# Patient Record
Sex: Female | Born: 1970 | Race: White | Hispanic: No | Marital: Single | Smoking: Never smoker
Health system: Southern US, Community
[De-identification: ages and names within clinical notes are randomized; demographics above are authoritative.]

## PROBLEM LIST (undated history)

## (undated) DIAGNOSIS — Z9889 Other specified postprocedural states: Secondary | ICD-10-CM

## (undated) DIAGNOSIS — G43809 Other migraine, not intractable, without status migrainosus: Secondary | ICD-10-CM

## (undated) DIAGNOSIS — F419 Anxiety disorder, unspecified: Secondary | ICD-10-CM

## (undated) DIAGNOSIS — I499 Cardiac arrhythmia, unspecified: Secondary | ICD-10-CM

## (undated) DIAGNOSIS — D119 Benign neoplasm of major salivary gland, unspecified: Secondary | ICD-10-CM

## (undated) DIAGNOSIS — R87619 Unspecified abnormal cytological findings in specimens from cervix uteri: Secondary | ICD-10-CM

## (undated) DIAGNOSIS — M1990 Unspecified osteoarthritis, unspecified site: Secondary | ICD-10-CM

## (undated) DIAGNOSIS — R55 Syncope and collapse: Secondary | ICD-10-CM

## (undated) DIAGNOSIS — B977 Papillomavirus as the cause of diseases classified elsewhere: Secondary | ICD-10-CM

## (undated) DIAGNOSIS — R112 Nausea with vomiting, unspecified: Secondary | ICD-10-CM

## (undated) DIAGNOSIS — L309 Dermatitis, unspecified: Secondary | ICD-10-CM

## (undated) DIAGNOSIS — I493 Ventricular premature depolarization: Secondary | ICD-10-CM

## (undated) DIAGNOSIS — K219 Gastro-esophageal reflux disease without esophagitis: Secondary | ICD-10-CM

## (undated) HISTORY — DX: Other migraine, not intractable, without status migrainosus: G43.809

## (undated) HISTORY — DX: Unspecified osteoarthritis, unspecified site: M19.90

## (undated) HISTORY — PX: ELBOW SURGERY: SHX618

## (undated) HISTORY — DX: Papillomavirus as the cause of diseases classified elsewhere: B97.7

## (undated) HISTORY — DX: Cardiac arrhythmia, unspecified: I49.9

## (undated) HISTORY — PX: EGD: SHX3789

## (undated) HISTORY — DX: Unspecified abnormal cytological findings in specimens from cervix uteri: R87.619

## (undated) HISTORY — DX: Benign neoplasm of major salivary gland, unspecified: D11.9

## (undated) HISTORY — PX: COLONOSCOPY: SHX174

## (undated) HISTORY — DX: Nausea with vomiting, unspecified: R11.2

## (undated) HISTORY — DX: Other specified postprocedural states: Z98.890

## (undated) HISTORY — PX: COLPOSCOPY: SHX161

## (undated) HISTORY — DX: Anxiety disorder, unspecified: F41.9

## (undated) HISTORY — PX: KNEE SURGERY: SHX244

## (undated) HISTORY — PX: ANKLE SURGERY: SHX546

## (undated) HISTORY — DX: Ventricular premature depolarization: I49.3

---

## 2013-11-27 ENCOUNTER — Emergency Department: Payer: BLUE CROSS/BLUE SHIELD

## 2013-11-27 ENCOUNTER — Emergency Department
Admission: EM | Admit: 2013-11-27 | Discharge: 2013-11-27 | Disposition: A | Discharge status: Home / Self Care | Payer: BLUE CROSS/BLUE SHIELD | Attending: Emergency Medicine | Admitting: Emergency Medicine

## 2013-11-27 DIAGNOSIS — S93401A Sprain of unspecified ligament of right ankle, initial encounter: Secondary | ICD-10-CM | POA: Insufficient documentation

## 2013-11-27 DIAGNOSIS — X58XXXA Exposure to other specified factors, initial encounter: Secondary | ICD-10-CM | POA: Insufficient documentation

## 2013-11-27 DIAGNOSIS — Y93K1 Activity, walking an animal: Secondary | ICD-10-CM | POA: Insufficient documentation

## 2013-11-27 DIAGNOSIS — S93491A Sprain of other ligament of right ankle, initial encounter: Secondary | ICD-10-CM

## 2013-11-27 MED ORDER — IBUPROFEN 600 MG PO TABS
600.0000 mg | ORAL_TABLET | Freq: Four times a day (QID) | ORAL | Status: DC | PRN
Start: 2013-11-27 — End: 2014-11-08

## 2013-11-27 MED ORDER — IBUPROFEN 600 MG PO TABS
600.0000 mg | ORAL_TABLET | Freq: Once | ORAL | Status: AC
Start: 2013-11-27 — End: 2013-11-27
  Administered 2013-11-27: 600 mg via ORAL
  Filled 2013-11-27: qty 1

## 2013-11-27 NOTE — ED Notes (Signed)
Patient was hiking and twisted right ankle.  Swelling to right lateral ankle.  No head injury.

## 2013-11-27 NOTE — Discharge Instructions (Signed)
Ankle Sprain    You have been diagnosed with an ankle sprain.    A sprain is a ligament injury, usually a tear or partial tear. Sprains can hurt as much as broken bones. Sprains can be classified by the degree of injury. A first-degree sprain is considered a minor tear. A second-degree sprain is a partial tear of the ligament. A third-degree sprain often involves a small fracture, or break, of the bone that the ligament is attached to.    Sprains are usually treated with pain medication and a splint to keep the joint from moving. You should Rest, Ice, Compress, and Elevate the injured ankle. Remember this as "RICE."   REST: Limit the use of the injured body part.   ICE: By applying ice to the affected area, swelling and pain can be reduced. Place some ice cubes in a re-sealable (Ziploc) bag and add some water. Put a thin washcloth between the bag and the skin. Apply the ice bag to the area for at least 20 minutes. Do this at least 4 times per day. Using the ice for longer times and more frequently is OK. NEVER APPLY ICE DIRECTLY TO THE SKIN.   COMPRESS: Compression means to apply pressure around the injured area such as with a splint, cast or an ACE bandage. Compression decreases swelling and improves comfort. Compression should be tight enough to relieve swelling but not so tight as to decrease circulation. Increasing pain, numbness, tingling, or change in skin color, are all signs of decreased circulation.   ELEVATE: Elevate the injured part. For example, a sprained ankle can be placed up on a chair while sitting and propped up on pillows while lying down.    You have been given an ACE BANDAGE. The bandage will compress the ankle. This increases comfort and reduces swelling. The ACE bandage should fit snugly but not so tight as to decrease circulation (blood supply). Watch for swelling of the area outside the ACE wrap. Check capillary refill (circulation) in your toenails. To do this, press on the  nail. It should turn white. When you let go, the nail should return to pink in less than 2 seconds. If it doesn't, the bandage is too tight. Loosen the wrap if you need to.    Wear the ACE bandage:   For the next 1-2 weeks.    Ankle exercises are described below. Begin the exercises as soon as you are able. They will make the ankle stronger to prevent new injuries. Do the exercises 5 to 10 times each day.   Use your big toe to draw out the letters of the alphabet on the ground. Move your ankle as you make each letter.   Sit with your leg straight out in front of you. Wrap a towel around the ball of your foot (just below your toes) and pull back. Pull hard enough to stretch the ankle. Don't pull hard enough to cause pain. Hold the stretch for 30 seconds.   Stand up. Rock onto the tiptoes of the injured foot and then return to the flat position. Repeat 10 times.   Rotate your ankle in a circle. Make 10 clockwise circles, then make 10 circles going the other way.    YOU SHOULD SEEK MEDICAL ATTENTION IMMEDIATELY, EITHER HERE OR AT THE NEAREST EMERGENCY DEPARTMENT, IF ANY OF THE FOLLOWING OCCURS:   Your pain gets much worse.   Your ankle or foot starts to tingle or it becomes numb.   Your foot   is cold or pale. This might mean there is a problem with circulation (blood supply).

## 2013-11-27 NOTE — ED Provider Notes (Signed)
EMERGENCY DEPARTMENT HISTORY AND PHYSICAL EXAM     Physician/Midlevel provider first contact with patient: 11/27/13 1700         Date: 11/27/2013  Patient Name: Tiffany Suarez    History of Presenting Illness     Chief Complaint   Patient presents with   . Ankle Pain       History Provided By: Pt    Chief Complaint: Ankle pain  Onset: 1 hr ago  Timing: sp twisting her ankle while walking her dog.  Location: R ankle, outside  Quality: Throbbing  Severity: Moderate  Modifying Factors: Worse when walking  Associated Symptoms: Denies    Additional History: Tiffany Suarez is a 43 y.o. female presents to the [facility] w R ankle pain after twisting her ankle after he dog yanked her while walking. Denies fall, head trauma, LOC. Denies numbness/tingling.     PCP: Pcp, Noneorunknown, MD      No current facility-administered medications for this encounter.     Current Outpatient Prescriptions   Medication Sig Dispense Refill   . ibuprofen (ADVIL,MOTRIN) 600 MG tablet Take 1 tablet (600 mg total) by mouth every 6 (six) hours as needed for Pain or Fever. 30 tablet 0   . medroxyprogesterone (DEPO-PROVERA) 150 MG/ML injection Inject 150 mg into the muscle every 3 (three) months.         Past History     Past Medical History:  History reviewed. No pertinent past medical history.    Past Surgical History:  Past Surgical History   Procedure Laterality Date   . No past surgeries         Family History:  History reviewed. No pertinent family history.    Social History:  History   Substance Use Topics   . Smoking status: Never Smoker    . Smokeless tobacco: Not on file   . Alcohol Use: Yes      Comment: social       Allergies:  Allergies   Allergen Reactions   . Sulfa Antibiotics        Review of Systems     Review of Systems   Constitutional: Negative for fever, chills and weight loss.   HENT: Negative for sore throat.    Respiratory: Negative for cough, shortness of breath and wheezing.    Cardiovascular: Negative for chest pain and  palpitations.   Gastrointestinal: Negative for vomiting, abdominal pain and diarrhea.   Genitourinary: Negative for frequency.   Musculoskeletal: Positive for joint pain. Negative for myalgias, back pain, falls and neck pain.   Skin: Negative for itching and rash.   Neurological: Negative for headaches.          Physical Exam     BP 126/60 mmHg  Pulse 95  Temp(Src) 97 F (36.1 C)  Resp 16  Ht 1.676 m  Wt 58.968 kg  BMI 20.99 kg/m2  SpO2 100%    Physical Exam   Constitutional: She appears well-developed and well-nourished. No distress.   Cardiovascular: Normal rate, regular rhythm, normal heart sounds and intact distal pulses.    Pulmonary/Chest: Effort normal and breath sounds normal. No respiratory distress. She has no wheezes.   Musculoskeletal: She exhibits edema and tenderness.        Right ankle: She exhibits decreased range of motion, swelling (Mild. Laterally) and ecchymosis (Mild, laterally). She exhibits no deformity, no laceration and normal pulse. Tenderness. Lateral malleolus and AITFL tenderness found. No medial malleolus, no CF ligament, no posterior TFL, no head  of 5th metatarsal and no proximal fibula tenderness found. Achilles tendon exhibits no pain and no defect.        Right upper leg: Normal.        Right lower leg: Normal.        Right foot: Normal. There is normal range of motion, no tenderness, no bony tenderness, no swelling, normal capillary refill, no crepitus, no deformity and no laceration.   Skin: Skin is warm and dry. She is not diaphoretic.   Nursing note and vitals reviewed.      Diagnostic Study Results     Labs -     Results     ** No results found for the last 24 hours. **          Radiologic Studies -   Radiology Results (24 Hour)     Procedure Component Value Units Date/Time    Ankle Right 3+ Views [[id number]] Collected:  11/27/13 1642    Order Status:  Completed Updated:  11/27/13 1647    Narrative:      History: Pt stated she got pulled by her dog while walking and  twisted  her ankle. Shielded Visit Pt Loc: [facility]-    PA and lateral and 2 oblique views demonstrate lateral soft tissue  swelling without acute fracture or dislocation. There is no bony  destructive lesion      Impression:       No acute fracture seen    Laurena Slimmer, MD   11/27/2013 4:43 PM          Medical Decision Making   I, Vira Browns, PA-C am the first provider for this patient.    I reviewed the vital signs, available nursing notes, past medical history, past surgical history, family history and social history, as appropriate.    Vital Signs-Reviewed the patient's vital signs.   Patient Vitals for the past 12 hrs:   BP Temp Pulse Resp   11/27/13 1531 126/60 mmHg 97 F (36.1 C) 95 16       Pulse Oximetry Analysis - 100% on RA    Assessment & Plan:  DW Dr. Carolynn Comment   43 y.o. female pw R ankle pain sp inverting her ankle.  Xray: Neg acute    -Ace wrap. Crutches. Ibuprofen for pain. Ortho fu. Strict return precautions provided.  The patient voiced understanding.     Diagnosis     Clinical Impression:   1. Sprain of other ligament of right ankle, initial encounter            Nelly Laurence, PA  11/27/13 2233    Harless Litten, MD  11/28/13 [phone]

## 2014-04-04 ENCOUNTER — Other Ambulatory Visit (INDEPENDENT_AMBULATORY_CARE_PROVIDER_SITE_OTHER): Payer: Self-pay | Admitting: Orthopaedic Surgery

## 2014-11-08 ENCOUNTER — Encounter (INDEPENDENT_AMBULATORY_CARE_PROVIDER_SITE_OTHER): Payer: Self-pay | Admitting: Orthopaedic Surgery

## 2014-11-08 ENCOUNTER — Ambulatory Visit (INDEPENDENT_AMBULATORY_CARE_PROVIDER_SITE_OTHER): Payer: BLUE CROSS/BLUE SHIELD | Admitting: Orthopaedic Surgery

## 2014-11-08 ENCOUNTER — Ambulatory Visit (INDEPENDENT_AMBULATORY_CARE_PROVIDER_SITE_OTHER): Payer: BLUE CROSS/BLUE SHIELD

## 2014-11-08 VITALS — BP 118/82 | HR 51 | Resp 16 | Ht 67.0 in | Wt 135.0 lb

## 2014-11-08 DIAGNOSIS — G8929 Other chronic pain: Secondary | ICD-10-CM

## 2014-11-08 DIAGNOSIS — M7711 Lateral epicondylitis, right elbow: Secondary | ICD-10-CM

## 2014-11-08 DIAGNOSIS — M25521 Pain in right elbow: Secondary | ICD-10-CM

## 2014-11-08 DIAGNOSIS — M7710 Lateral epicondylitis, unspecified elbow: Secondary | ICD-10-CM | POA: Insufficient documentation

## 2014-11-08 MED ORDER — DIFLUNISAL 500 MG PO TABS
500.0000 mg | ORAL_TABLET | Freq: Two times a day (BID) | ORAL | Status: DC
Start: 2014-11-08 — End: 2014-11-21

## 2014-11-08 NOTE — Progress Notes (Signed)
Tiffany Suarez is a 44 y.o. female with the following history as recorded in EpicCare:  Patient Active Problem List    Diagnosis Date Noted   . Elbow pain, chronic, right 11/08/2014   . Lateral epicondylitis (tennis elbow) 11/08/2014     Current Outpatient Prescriptions   Medication Sig Dispense Refill   . medroxyprogesterone (DEPO-PROVERA) 150 MG/ML injection Inject 150 mg into the muscle every 3 (three) months.     . diflunisal (DOLOBID) 500 MG Tab tablet Take 1 tablet (500 mg total) by mouth 2 (two) times daily. 30 tablet 2     No current facility-administered medications for this visit.     Allergies: Sulfa antibiotics  History reviewed. No pertinent past medical history.  Past Surgical History   Procedure Laterality Date   . No past surgeries       History reviewed. No pertinent family history.  Social History   Substance Use Topics   . Smoking status: Never Smoker    . Smokeless tobacco: Not on file   . 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  She was walking 2 dogs when her right ankle gave way and she fell  landing on her right forearm.  She has had pain on the lateral aspect of  the right elbow since that time and occasionally extends up the arm and  down to the wrist, even into the fingers on occasion.  Her primary care  physician has prescribed tramadol and a Medrol Dosepak with temporary  relief.  She was then switched to Tylenol No. 3 and gabapentin, which she  is not taking.  She has soreness when she is getting dressed, when she  brushing her hair, and when she writing.     GENERAL HEALTH, MEDICATIONS, ALLERGIES:  Otherwise noted on the Epic record above.     SOCIAL HISTORY, FAMILY HISTORY:  Noted on the Epic  record above.     REVIEW OF SYSTEMS:  Noted on the Epic record above as outlined in the HPI.  All other systems  reviewed are negative.     Exam reveals a healthy patient in no acute distress.  She has satisfactory  cervical spine range of motion with no referred pain.  She has satisfactory  motion in the shoulders, elbows, wrists, and hands with good strength  throughout.  There is no pain on active motion of the right shoulder  against resistance and there is good strength about the shoulder.  The  right elbow reveals full range of motion, tenderness at the lateral  epicondyle aggravated by wrist extension against resistance.  There is no  tenderness elsewhere in the arm.  There is no swelling in the arm.  No skin  rashes or lesions in the arm.  The neurovascular status of both arms is  intact.     X-rays of right elbow show no gross abnormalities.     IMPRESSION:  1.  Pain, right elbow.  2.  Lateral epicondylitis (tennis elbow) right elbow.     Treatment options were discussed.  It was elected to  proceed with a  conservative program of ice for pain and swelling.  Dolobid was prescribed  for pain.  She will begin physical therapy.  She is to avoid pushups, pull  ups, and weight training until next visit in 6 weeks.  She will work her  elbow and wrist exercises daily.  If there is ongoing pain in 6 weeks, a  local cortisone injection to the lateral epicondyle of the right elbow  would be the next step.

## 2014-11-08 NOTE — Patient Instructions (Signed)
Ice for elbow pain/swelling   Dolobid for pain  PT as directed  Elbow exercises daily  Avoid push-ups, pull-ups and heavy weight training for 6 weeks   F/U 6 weeks

## 2014-11-13 ENCOUNTER — Telehealth (INDEPENDENT_AMBULATORY_CARE_PROVIDER_SITE_OTHER): Payer: Self-pay

## 2014-11-20 ENCOUNTER — Encounter (INDEPENDENT_AMBULATORY_CARE_PROVIDER_SITE_OTHER): Payer: Self-pay

## 2014-11-21 ENCOUNTER — Ambulatory Visit: Payer: BLUE CROSS/BLUE SHIELD

## 2014-11-22 ENCOUNTER — Ambulatory Visit: Payer: BLUE CROSS/BLUE SHIELD | Admitting: Pain Medicine

## 2014-11-22 ENCOUNTER — Ambulatory Visit
Admission: RE | Admit: 2014-11-22 | Discharge: 2014-11-22 | Disposition: A | Discharge status: Home / Self Care | Payer: BLUE CROSS/BLUE SHIELD | Source: Ambulatory Visit | Attending: Foot Surgery | Admitting: Foot Surgery

## 2014-11-22 ENCOUNTER — Ambulatory Visit: Payer: BLUE CROSS/BLUE SHIELD | Admitting: Foot Surgery

## 2014-11-22 ENCOUNTER — Encounter
Admission: RE | Disposition: A | Discharge status: Home / Self Care | Payer: Self-pay | Source: Ambulatory Visit | Attending: Foot Surgery

## 2014-11-22 DIAGNOSIS — S93491A Sprain of other ligament of right ankle, initial encounter: Secondary | ICD-10-CM | POA: Insufficient documentation

## 2014-11-22 DIAGNOSIS — W19XXXA Unspecified fall, initial encounter: Secondary | ICD-10-CM | POA: Insufficient documentation

## 2014-11-22 DIAGNOSIS — S86311A Strain of muscle(s) and tendon(s) of peroneal muscle group at lower leg level, right leg, initial encounter: Secondary | ICD-10-CM | POA: Insufficient documentation

## 2014-11-22 HISTORY — PX: REPAIR, LOWER EXTREMITY TENDON: SHX5271

## 2014-11-22 HISTORY — PX: STABILIZATION, ANKLE, BROSTROM PROCEDURE: SHX5553

## 2014-11-22 HISTORY — DX: Syncope and collapse: R55

## 2014-11-22 HISTORY — DX: Dermatitis, unspecified: L30.9

## 2014-11-22 SURGERY — PRIMARY REPAIR, DISRUPTED LIGAMENT, ANKLE, COLLATERAL
Anesthesia: Anesthesia General | Site: Leg Lower | Laterality: Right | Wound class: Clean

## 2014-11-22 MED ORDER — LACTATED RINGERS IV SOLN
INTRAVENOUS | Status: DC | PRN
Start: 2014-11-22 — End: 2014-11-22

## 2014-11-22 MED ORDER — LACTATED RINGERS IV SOLN
50.0000 mL/h | INTRAVENOUS | Status: DC
Start: 2014-11-22 — End: 2014-11-22

## 2014-11-22 MED ORDER — MIDAZOLAM HCL 2 MG/2ML IJ SOLN
INTRAMUSCULAR | Status: AC
Start: 2014-11-22 — End: ?
  Filled 2014-11-22: qty 2

## 2014-11-22 MED ORDER — ONDANSETRON HCL 4 MG/2ML IJ SOLN
INTRAMUSCULAR | Status: AC
Start: 2014-11-22 — End: 2014-11-22
  Administered 2014-11-22: 4 mg via INTRAVENOUS
  Filled 2014-11-22: qty 2

## 2014-11-22 MED ORDER — OXYCODONE-ACETAMINOPHEN 5-325 MG PO TABS
ORAL_TABLET | ORAL | Status: AC
Start: 2014-11-22 — End: 2014-11-22
  Administered 2014-11-22: 1 via ORAL
  Filled 2014-11-22: qty 1

## 2014-11-22 MED ORDER — SODIUM CHLORIDE 0.9 % IV MBP
1.0000 g | INTRAVENOUS | Status: AC
Start: 2014-11-22 — End: 2014-11-22
  Administered 2014-11-22: 1 g via INTRAVENOUS

## 2014-11-22 MED ORDER — LIDOCAINE HCL 1 % IJ SOLN
INTRAMUSCULAR | Status: AC
Start: 2014-11-22 — End: ?
  Filled 2014-11-22: qty 20

## 2014-11-22 MED ORDER — FENTANYL CITRATE (PF) 50 MCG/ML IJ SOLN (WRAP)
25.0000 ug | INTRAMUSCULAR | Status: AC | PRN
Start: 2014-11-22 — End: 2014-11-22
  Administered 2014-11-22 (×3): 25 ug via INTRAVENOUS

## 2014-11-22 MED ORDER — OXYCODONE-ACETAMINOPHEN 5-325 MG PO TABS
1.0000 | ORAL_TABLET | Freq: Once | ORAL | Status: AC
Start: 2014-11-22 — End: 2014-11-22

## 2014-11-22 MED ORDER — FENTANYL CITRATE (PF) 50 MCG/ML IJ SOLN (WRAP)
INTRAMUSCULAR | Status: DC | PRN
Start: 2014-11-22 — End: 2014-11-22
  Administered 2014-11-22: 25 ug via INTRAVENOUS
  Administered 2014-11-22: 50 ug via INTRAVENOUS
  Administered 2014-11-22: 25 ug via INTRAVENOUS

## 2014-11-22 MED ORDER — OXYCODONE-ACETAMINOPHEN 5-325 MG PO TABS
1.0000 | ORAL_TABLET | Freq: Once | ORAL | Status: DC | PRN
Start: 2014-11-22 — End: 2014-11-22

## 2014-11-22 MED ORDER — MIDAZOLAM HCL 2 MG/2ML IJ SOLN
INTRAMUSCULAR | Status: DC | PRN
Start: 2014-11-22 — End: 2014-11-22
  Administered 2014-11-22: 2 mg via INTRAVENOUS

## 2014-11-22 MED ORDER — DEXAMETHASONE SODIUM PHOSPHATE 4 MG/ML IJ SOLN (WRAP)
INTRAMUSCULAR | Status: DC | PRN
Start: 2014-11-22 — End: 2014-11-22
  Administered 2014-11-22: 10 mg via INTRAVENOUS

## 2014-11-22 MED ORDER — BUPIVACAINE HCL 0.5 % IJ SOLN
INTRAMUSCULAR | Status: DC | PRN
Start: 2014-11-22 — End: 2014-11-22
  Administered 2014-11-22: 10 mL

## 2014-11-22 MED ORDER — PHENYLEPHRINE HCL 10 MG/ML IV SOLN (WRAP)
Status: DC | PRN
Start: 2014-11-22 — End: 2014-11-22
  Administered 2014-11-22 (×2): 50 ug via INTRAVENOUS
  Administered 2014-11-22 (×2): 100 ug via INTRAVENOUS

## 2014-11-22 MED ORDER — LIDOCAINE HCL (PF) 2 % IJ SOLN
INTRAMUSCULAR | Status: AC
Start: 2014-11-22 — End: ?
  Filled 2014-11-22: qty 5

## 2014-11-22 MED ORDER — PROPOFOL 10 MG/ML IV EMUL (WRAP)
INTRAVENOUS | Status: AC
Start: 2014-11-22 — End: ?
  Filled 2014-11-22: qty 50

## 2014-11-22 MED ORDER — PHENYLEPHRINE 100 MCG/ML IN NACL 0.9% IV SOSY
PREFILLED_SYRINGE | INTRAVENOUS | Status: AC
Start: 2014-11-22 — End: ?
  Filled 2014-11-22: qty 5

## 2014-11-22 MED ORDER — FENTANYL CITRATE (PF) 50 MCG/ML IJ SOLN (WRAP)
INTRAMUSCULAR | Status: AC
Start: 2014-11-22 — End: 2014-11-22
  Administered 2014-11-22: 25 ug via INTRAVENOUS
  Filled 2014-11-22: qty 2

## 2014-11-22 MED ORDER — FENTANYL CITRATE (PF) 50 MCG/ML IJ SOLN (WRAP)
INTRAMUSCULAR | Status: AC
Start: 2014-11-22 — End: ?
  Filled 2014-11-22: qty 2

## 2014-11-22 MED ORDER — LIDOCAINE HCL 2 % IJ SOLN
INTRAMUSCULAR | Status: DC | PRN
Start: 2014-11-22 — End: 2014-11-22
  Administered 2014-11-22: 60 mg

## 2014-11-22 MED ORDER — DEXAMETHASONE SOD PHOSPHATE PF 10 MG/ML IJ SOLN
INTRAMUSCULAR | Status: AC
Start: 2014-11-22 — End: ?
  Filled 2014-11-22: qty 1

## 2014-11-22 MED ORDER — PROMETHAZINE HCL 25 MG/ML IJ SOLN
6.2500 mg | Freq: Once | INTRAMUSCULAR | Status: DC | PRN
Start: 2014-11-22 — End: 2014-11-22

## 2014-11-22 MED ORDER — SODIUM CHLORIDE 0.9 % IV SOLN
INTRAVENOUS | Status: AC
Start: 2014-11-22 — End: ?
  Filled 2014-11-22: qty 100

## 2014-11-22 MED ORDER — LIDOCAINE HCL 1 % IJ SOLN
INTRAMUSCULAR | Status: DC | PRN
Start: 2014-11-22 — End: 2014-11-22
  Administered 2014-11-22: 10 mL

## 2014-11-22 MED ORDER — PROPOFOL INFUSION 10 MG/ML
INTRAVENOUS | Status: DC | PRN
Start: 2014-11-22 — End: 2014-11-22
  Administered 2014-11-22: 150 mg via INTRAVENOUS

## 2014-11-22 MED ORDER — ONDANSETRON HCL 4 MG/2ML IJ SOLN
INTRAMUSCULAR | Status: DC | PRN
Start: 2014-11-22 — End: 2014-11-22
  Administered 2014-11-22: 4 mg via INTRAVENOUS

## 2014-11-22 MED ORDER — BUPIVACAINE HCL (PF) 0.5 % IJ SOLN
INTRAMUSCULAR | Status: AC
Start: 2014-11-22 — End: ?
  Filled 2014-11-22: qty 30

## 2014-11-22 MED ORDER — HYDROMORPHONE HCL 1 MG/ML IJ SOLN
0.5000 mg | INTRAMUSCULAR | Status: DC | PRN
Start: 2014-11-22 — End: 2014-11-22

## 2014-11-22 MED ORDER — CEFAZOLIN SODIUM 1 G IJ SOLR
INTRAMUSCULAR | Status: AC
Start: 2014-11-22 — End: ?
  Filled 2014-11-22: qty 1000

## 2014-11-22 MED ORDER — ONDANSETRON HCL 4 MG/2ML IJ SOLN
4.0000 mg | Freq: Once | INTRAMUSCULAR | Status: AC | PRN
Start: 2014-11-22 — End: 2014-11-22

## 2014-11-22 MED ORDER — ONDANSETRON HCL 4 MG/2ML IJ SOLN
INTRAMUSCULAR | Status: AC
Start: 2014-11-22 — End: ?
  Filled 2014-11-22: qty 2

## 2014-11-22 SURGICAL SUPPLY — 66 items
BANDAGE CMPR PLSTR CTTN CRTY CNFRM 75X3 (Bandage) ×2 IMPLANT
BANDAGE CMPR PLSTR CTTN MED MTRX 5YDX6IN (Bandage) ×2
BANDAGE ELASTIC L5 YD X W6 IN W/SELF-CLOSURE HOOK (Bandage) ×4 IMPLANT
BANDAGE ESMARK STRL 4INX9FT (Procedure Accessories) ×3 IMPLANT
BANDAGE MEDIUM ELASTIC MATRIX POLYESTER COTTON L5 YD X W6 IN (Bandage) IMPLANT
BANDAGE MEDLINE MEDIUM COMPRESSION L5 YD (Bandage) ×4
BLADE S/SU RIBBACK CARB STL 15 (Blade) ×6 IMPLANT
BLADE SAG 4.5X25.5X.4MM (Blade) ×3 IMPLANT
BLADE SAGITTAL 9.5X25.5X 4MM (Blade) ×3 IMPLANT
DRAPE 3/4 SHEET FANFLD 52X76IN (Drape) ×3 IMPLANT
DRAPE LWR EXT 114X88X126IN (Drape) ×3 IMPLANT
DRESSING ADAPTIC NON-ADH ROLL (Dressing) ×3 IMPLANT
DRESSING PETRO 3% BI 3BRM GZE XR 8X1IN (Dressing) ×1
DRESSING PETROLATUM XEROFORM L8 IN X W1 (Dressing) ×2
DRESSING PETROLATUM XEROFORM L8 IN X W1 IN 3% BISMUTH TRIBROMOPHENATE (Dressing) IMPLANT
ELECTRODE ELECTROSURGICAL BLADE PENCIL (Cautery) ×2
ELECTRODE ELECTROSURGICAL BLADE PENCIL L10 FT VALLEYLAB E2515H (Cautery) ×2 IMPLANT
ELECTRODE ESURG SS BLDE PNCL VLAB 10FT (Cautery) ×1
GLOVE SURG BIOGEL ORTHO SZ8 (Glove) ×3 IMPLANT
GLOVE SURG BIOGEL SZ7.5 (Glove) ×6 IMPLANT
GOWN OPTIMA STRL BACK OR (Gown) ×6 IMPLANT
MASTISOL VIAL 2/3CC STRL (Skin Closure) ×3 IMPLANT
NEEDLE 25GA 1 1/2 (Needles) ×6 IMPLANT
NEEDLE ELECTRODE (Cautery) ×3 IMPLANT
PACK EXTREMITY IAH (Pack) ×3 IMPLANT
PAD ELECTROSRG GRND REM W CRD (Procedure Accessories) ×3 IMPLANT
PAD PREP CUFF 24X41IN W 9IN (Prep) ×3 IMPLANT
PADDING CAST L4 YD X W6 IN UNDERCAST (Bandage) ×4
PADDING CAST L4 YD X W6 IN UNDERCAST HAND TEARABLE SPECIALIST 100 (Bandage) IMPLANT
PADDING CST CTTN SPCLST 100 4YDX6IN LF (Bandage) ×2
SOL IRR 0.9% NACL 500ML PLS PR BTL ISTNC (Irrigation Solutions) ×1
SOLUTION IRRIGATION 0.9% SDM CHLORIDE 500ML PR BTTL ISOTONIC NONPRGNC (Irrigation Solutions) ×2 IMPLANT
SOLUTION IRRIGATION 0.9% SODIUM CHLORIDE (Irrigation Solutions) ×2
SOLUTION SRGPRP 74% ISPRP 0.7% IOD (Prep) ×1
SOLUTION SURGICAL PREP 26 ML DURAPREP (Prep) ×2
SOLUTION SURGICAL PREP 26 ML DURAPREP 74% ISOPROPYL ALCOHOL 0.7% (Prep) ×2 IMPLANT
SPLINT ORTHOPEDIC L30 IN X W5 IN PRECUT (Cast) ×2
SPLINT ORTHOPEDIC L30 IN X W5 IN PRECUT LIGHTWEIGHT WASHABLE (Cast) IMPLANT
SPLNT SAFETYSPLINT ORTH 30X5IN FBGLS (Cast) ×1
SPONGE GAUZE L6 3/4 IN X W6 IN MEDIUM (Dressing) ×2
SPONGE GAUZE L6 3/4 IN X W6 IN MEDIUM ABSORBENT FLUFF DRY CRINKLE (Dressing) ×2 IMPLANT
SPONGE GZE CTTN MED KRLX 6.75X6IN LF (Dressing) ×1
STRIP SKIN CLOSURE L3 IN X W.25 IN (Dressing) ×2
STRIP SKIN CLOSURE L3 IN X W.25 IN REINFORCE STERI-STRIP POLYESTER (Dressing) ×2 IMPLANT
STRIP SKIN CLOSURE L4 IN X W1/4 IN (Dressing) ×2
STRIP SKIN CLOSURE L4 IN X W1/4 IN REINFORCE STERI-STRIP POLYESTER (Dressing) ×2 IMPLANT
STRIP SKNCLS PLSTR STRSTRP 3X.25IN LF (Dressing) ×1
STRIP STRSTRP SKNCLS 4X.25IN PLSTR REINF (Dressing) ×1
SUTURE ABS 3-0 SH VCL 27IN BRD COAT VIOL (Suture) ×1
SUTURE COATED VICRYL 3-0 SH L27 IN BRAID (Suture) ×2
SUTURE COATED VICRYL 3-0 SH L27 IN BRAID COATED VIOLET ABSORBABLE (Suture) IMPLANT
SUTURE ETHILON BLACK 3-0 PS-2 L18 IN (Suture) ×4
SUTURE ETHILON BLACK 3-0 PS-2 L18 IN MONOFILAMENT NONABSORBABLE (Suture) IMPLANT
SUTURE NABSB 3-0 PS2 ETH MTPS 18IN MFL (Suture) ×2
SUTURE NABSB 3-0 PS2 PRLN MTPS 18IN MFL (Suture) ×1
SUTURE PROLENE BLUE 3-0 PS-2 L18 IN (Suture) ×2
SUTURE PROLENE BLUE 3-0 PS-2 L18 IN MONOFILAMENT NONABSORBABLE (Suture) ×2 IMPLANT
SUTURE VICRYL 3-0 PS1 18IN (Suture) ×3 IMPLANT
SUTURE VICRYL 4-0 PS1 27IN (Suture) ×6 IMPLANT
SYRINGE LUER LOCK 10CC (Syringes, Needles) ×6 IMPLANT
SYRINGE SLIP-TIP 10CC (Syringes, Needles) ×6 IMPLANT
TOURNIQUET 18IN STRL (Procedure Accessories) ×3 IMPLANT
TOWEL L26 IN X W17 IN COTTON PREWASH (Procedure Accessories) ×2
TOWEL L26 IN X W17 IN COTTON PREWASH DELINT BLUE ACTISORB SURGICAL (Procedure Accessories) ×2 IMPLANT
TOWEL SRG CTTN 26X17IN LF STRL PREWASH (Procedure Accessories) ×1
TRAY EXTREMITY PACK (Pack) ×6 IMPLANT

## 2014-11-22 NOTE — PACU (Signed)
Pt to phase 2 awake, VSS, no distress, pain and nausea controlled, IV patent, dressing to right foot/leg CDI and report given to receiving Rn Janie.

## 2014-11-22 NOTE — Transfer of Care (Signed)
Anesthesia Transfer of Care Note    Patient: Tiffany Suarez    Procedures performed: Procedure(s):  LATERAL ANKLE STABILIZATION; PERONEAL TENDON REPAIR  REPAIR, LOWER EXTREMITY PERONEAL TENDON     Anesthesia type: General LMA    Patient location:Phase I PACU    Last vitals:   Filed Vitals:    11/22/14 1716   BP: 124/76   Pulse: 87   Temp: 36.2 C (97.2 F)   Resp: 16   SpO2: 100%       Post pain: Patient not complaining of pain, continue current therapy      Mental Status:awake    Respiratory Function: tolerating room air    Cardiovascular: stable    Nausea/Vomiting: patient not complaining of nausea or vomiting    Hydration Status: adequate    Post assessment: no apparent anesthetic complications, no reportable events and no evidence of recall

## 2014-11-22 NOTE — Consults (Signed)
H & P  Reviewed, no changes, cleared for surgery.\    Tiffany Suarez [name]

## 2014-11-22 NOTE — Discharge Instructions (Signed)
Discharge Instructions for Ankle Surgery  You had ankle surgery. This surgery is performed for a variety of reasons. You and your doctor discussed your condition and the surgery before the procedure. Here are instructions that will help you care for your ankle when you are at home.  Activity   [place] stay on the first floor of your home as much as possible for the first week after the surgery.   Arrange your household to keep the items you need handy.   Remove electrical cords, throw rugs,and anything else that may cause you to fall.   Use nonslip bath mats, grab bars, an elevated toilet seat, and a shower chair in your bathroom.   Use a cane, crutches, a walker, or handrails untilyour balance, flexibility, and strength improve. And remember to ask for help from others when you need it.   Free up your hands so that you can use them to keep balance. Do this by using a fanny pack, apron, or pockets to carry things.   Follow the weight-bearing instructions given to [facility] your doctor. He or she will tell you how much weight you are--or are not--allowed to put on your ankle.   Do allexercises you learned in the hospital, as instructed by your doctor.   Don't drive until your doctor says it's OK. And never drive if you are taking opioid pain medication.   Follow your doctor's instructions regarding care for your dressing, splint, or cast. A supportive dressing, splint, or cast may be applied after surgery to protect your ankle as it heals.  Home care   Avoid soaking your ankle in water until your incisions are completelyclosed and dried.   Follow your doctor's instructions regarding showering. He or she will tell you when you can begin showering again. Then shower as needed. Carefully cover your ankle with plastic to keep the dressing, splint, or cast dry. To avoid falling while showering, sit on a shower stool or chair.   Use an ice pack or bag of frozen peas--or something similar--wrapped in a thin  towel to reduce the swelling. Keep the foot elevated. Apply the ice pack for28minutes; then remove it for 20 minutes. Repeat as needed.   Take pain medication as directed.   Sleep withtwo pillows under your knee and ankle. Keep your ankle elevated above the level of your heart when sitting in a chair or on the couch.     [address] [employer] [address] [place], [place] [address]. All rights reserved. This information is not intended as a substitute for professional medical care. Always follow your healthcare professional's instructions.    Post Anesthesia Discharge Instructions    Although you may be awake and alert in the recovery room, small amounts of anesthetic remain in your system for about 24 hours.  You may feel tired and sleepy during this time.      You are advised to go directly home from the hospital.    Plan to stay at home and rest for the remainder of the day.    It is advisable to have someone with you at home for 24 hours after surgery.    Do not operate a motor vehicle, or any mechanical or electrical equipment for the next 24 hours.      Be careful when you are walking around, you may become dizzy.  The effects of anesthesia and/or medications are still present and drowsiness may occur    Do not consume alcohol, tranquilizers,  drowsiness may occur    Do not consume alcohol, tranquilizers, sleeping medications, or any other non prescribed medication for the remainder of the day.    Diet:  begin with liquids, progress your diet as tolerated or as directed by your surgeon.  Nausea and vomiting may occur in the next 24 hours.

## 2014-11-22 NOTE — Anesthesia Preprocedure Evaluation (Signed)
Anesthesia Evaluation    AIRWAY    Mallampati: II    TM distance: >3 FB  Neck ROM: full  Mouth Opening:full   CARDIOVASCULAR    cardiovascular exam normal       DENTAL    no notable dental hx     PULMONARY    pulmonary exam normal     OTHER FINDINGS                      Anesthesia Plan    ASA 2     general               (Chart reviewed, patient examined. Questions answered.  Risks, benefits and alternatives discussed with patient.  Patient states understanding and desires to proceed with anesthetic plan.  )      intravenous induction   Detailed anesthesia plan: general IV            informed consent obtained    Plan discussed with CRNA.

## 2014-11-22 NOTE — Anesthesia Postprocedure Evaluation (Signed)
Anesthesia Post Evaluation    Patient: Tiffany Suarez    Procedures performed: Procedure(s):  LATERAL ANKLE STABILIZATION; PERONEAL TENDON REPAIR  REPAIR, LOWER EXTREMITY PERONEAL TENDON     Anesthesia type: General LMA    Patient location:Phase I PACU    Last vitals:   Filed Vitals:    11/22/14 1800   BP: 106/67   Pulse: 87   Temp: 36.8   Resp: 16   SpO2: 100%       Post pain: Patient not complaining of pain, continue current therapy      Mental Status:awake and alert     Respiratory Function: tolerating nasal cannula    Cardiovascular: stable    Nausea/Vomiting: patient not complaining of nausea or vomiting    Hydration Status: adequate    Post assessment: no apparent anesthetic complications

## 2014-11-22 NOTE — Brief Op Note (Signed)
BRIEF OP NOTE    Date Time: 5:16 PM, 11/22/2014      Patient Name:   Tiffany Suarez    Date of Operation:   11/22/2014    Providers Performing:   Dr. 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    Drains:   none    Complications:   None.    Findings: See full op note    Condition:   good. Patient brought to the PACU with AVSS and NVS unchanged to operative foot                                                                              Tillman Abide DPM

## 2014-11-23 ENCOUNTER — Encounter: Payer: Self-pay | Admitting: Foot Surgery

## 2014-11-23 NOTE — Op Note (Addendum)
Procedure Date: 11/22/2014     Patient Type: A     SURGEON: Kandice Robinsons DPM  ASSISTANT:  Tillman Abide DPM     PREOPERATIVE DIAGNOSES:  1.  Anterior talofibular ligament rupture right lower extremity.  2.  Peroneal tendinosis right lower extremity.     POSTOPERATIVE DIAGNOSES:  1.  Anterior talofibular ligament rupture right lower extremity.  2.  Peroneal tendinosis right lower extremity.     TITLE OF PROCEDURE:  1.  Lateral ankle stabilization right lower extremity.  2.  Peritoneal brevis tendon repair right lower extremity.     ANESTHESIA:  General.     ESTIMATED BLOOD LOSS:  5 mL.     HEMOSTASIS:  Pneumatic thigh tourniquet at 350 mmHg for 65 minutes.     IMPLANTS:  None.     MATERIALS:  3-0 Ethibond, 0 Ethibond, 3-0 Vicryl, 4-0 nylon.     INJECTABLES:  Preoperative 20 mL of a 1:1 dilution of 1% lidocaine plain and 0.5%  Marcaine plain.     PATHOLOGY:  None.     ANTIBIOTICS:  One g of Ancef.     DRAINS:  None.     COMPLICATIONS:  None.     CONDITION:  Good, patient brought to PACU with all vital signs stable and neurovascular  status unchanged to operative extremity.     INDICATIONS:  Tiffany Suarez is a 44 year old female who presents to the office of Dr.  Ashok Cordia with the chief complaint of pain to her lateral aspect of  her right ankle.  The patient states that she fell almost a year ago and  injured it and has been dealing with pain since then.  MRI revealed a  complete rupture of her ATFL along with tendinosis to her peroneal brevis  tendon.  Given worsening progression of her symptoms and failure to respond  to conservative measures, and given the worsening progression of her  deformity, and failure to respond to conservative measures, the patient has  elected for operative intervention.  At this time, alternatives, risks, and  complications to the procedure were thoroughly explained to the patient.   The patient exhibits appropriate understanding of all discussion points and  informed  consent is signed in chart with no guarantees of surgical outcome  given or implied.     DESCRIPTION OF PROCEDURE:  The patient was brought back to the operating room, placed on the operating  room table in the lateral decubitus position with beanbag.  The patient was  secured to the table with safety belt.  All bony prominences well padded.   Following the surgical timeout, where all members of the operating room and  surgical site were identified, IV sedation occurred and LMA was placed.   Local anesthesia was then administered about the operative site utilizing  20 mL of a 1:1 dilution of 1% lidocaine plain and 0.5% Marcaine plain.  A  well-padded pneumatic thigh tourniquet was then placed in the superior  thigh position.  The right foot was then prepped and draped in the usual  sterile fashion and following exsanguination with a Esmarch the tourniquet  was inflated to 350 mmHg and the following procedure began.  A well-planned  incision was made overlying the fibula, carrying distally to overlie the  peroneal tendons.  This incision was then made with a 15-blade.  Sharp  dissection was carried down to the level of the peroneal tendon sheath.   Next, utilizing the iris scissors, sharp dissection was  carried down.  The  peroneal tendon sheath was incised and this was followed both proximally  and distally in order to identify the peroneal brevis tendon.  The peroneal  brevis tendon was identified and then examined.  It was noted that there  was a longitudinal partial tear that started just distal to the fibula and  carried down about 4 cm.  This area was then curetted with a curette and  debrided with a 15-blade.  Next, utilizing 3-0 Ethibond in a running suture  type fashion the tendon was reapproximated.     Next, attention was directed to the anterolateral ankle at the level where  the ATFL should be identified.  Sharp dissection was carried down.  The  ATFL was identified and it was noted to be ruptured and in  poor quality.   Next, utilizing 0 Ethibond the tendon was recreated in a horizontal  mattress-type fashion with the foot in dorsiflexed and everted position.   The area was then copiously irrigated with normal saline.  It was noted  that the patient had more stable ankle.  Next, the peroneal tendon sheath  was reapproximated with 3-0 Vicryl in a running suture type fashion.  The  area was then copiously irrigated with normal saline.  Subcutaneous tissues  were reapproximated with 3-0 Vicryl, and the skin was reapproximated with  4-0 nylon in a running interlocking suture type fashion.  The area was then  dressed with Xeroform, sterile 4 x 4's, Kerlix, Webril, and a well-padded  posterior splint was then applied.  The patient tolerated the above  procedure and anesthesia well with all vital signs stable and neurovascular  status unchanged to the operative extremity.  The patient tolerated the  above procedure and anesthesia well.  The tourniquet was deflated after 65  minutes with prompt hyperemic response noted to all digits of the operative  extremity.  The patient was transferred from OR to recovery at the  discretion of anesthesia.  The patient will follow protocol of rest, ice,  and elevation, and will be nonweightbearing to the operative extremity.   The patient will be discharged once cleared by anesthesia and will follow  up for the entire postoperative period in the office of  Dr. Ashok Cordia.           D:  11/22/2014 17:25 PM by Dr. Maxine Glenn L. [place], DPM [phone])  T:  11/23/2014 11:25 AM by       (Conf: [id number]) (Doc ID: [id number])

## 2015-01-17 ENCOUNTER — Encounter (INDEPENDENT_AMBULATORY_CARE_PROVIDER_SITE_OTHER): Payer: Self-pay | Admitting: Orthopaedic Surgery

## 2015-01-17 ENCOUNTER — Ambulatory Visit (INDEPENDENT_AMBULATORY_CARE_PROVIDER_SITE_OTHER): Payer: BLUE CROSS/BLUE SHIELD | Admitting: Orthopaedic Surgery

## 2015-01-17 VITALS — BP 136/84 | HR 75 | Resp 18 | Ht 67.0 in | Wt 135.0 lb

## 2015-01-17 DIAGNOSIS — M25521 Pain in right elbow: Secondary | ICD-10-CM

## 2015-01-17 DIAGNOSIS — M7711 Lateral epicondylitis, right elbow: Secondary | ICD-10-CM

## 2015-01-17 DIAGNOSIS — G5622 Lesion of ulnar nerve, left upper limb: Secondary | ICD-10-CM

## 2015-01-17 MED ORDER — TRIAMCINOLONE ACETONIDE 40 MG/ML IJ SUSP
30.0000 mg | Freq: Once | INTRAMUSCULAR | Status: AC
Start: 2015-01-17 — End: 2015-01-17
  Administered 2015-01-17: 32 mg via INTRA_ARTICULAR

## 2015-01-17 NOTE — Patient Instructions (Signed)
Ice for elbow pain/swelling   OTCs for pain  PT as directed  Elbow exercises daily  Avoid push-ups, pull-ups and heavy weight training for 6 weeks   F/U PRN

## 2015-01-17 NOTE — Progress Notes (Signed)
Tiffany Suarez is a 44 y.o. female with the following history as recorded in EpicCare:  Patient Active Problem List    Diagnosis Date Noted   . Ulnar neuritis, left 01/17/2015   . Elbow pain, chronic, right 11/08/2014   . Lateral epicondylitis (tennis elbow) 11/08/2014     No current outpatient prescriptions on file.     No current facility-administered medications for this visit.     Allergies: Sulfa antibiotics  Past Medical History   Diagnosis Date   . Syncope and collapse      18 years ago. Worked up by cardiologist, determined not to be cardiac related.    . Eczema      allergy induced     Past Surgical History   Procedure Laterality Date   . No past surgeries     . Stabilization, ankle, brostrom procedure Right 11/22/2014     Procedure: LATERAL ANKLE STABILIZATION; PERONEAL TENDON REPAIR;  Surgeon: Kandice Robinsons, DPM;  Location: [facility];  Service: Podiatry;  Laterality: Right;   . Repair, lower extremity tendon Right 11/22/2014     Procedure: REPAIR, LOWER EXTREMITY PERONEAL TENDON ;  Surgeon: Kandice Robinsons, DPM;  Location: [facility];  Service: Podiatry;  Laterality: Right;     Family History   Problem Relation Age of Onset   . Adopted: Yes     Social History   Substance Use Topics   . Smoking status: Never Smoker    . Smokeless tobacco: Not on file   . Alcohol Use: Yes      Comment: social     Filed Vitals:    01/17/15 1002   BP: 136/84   Pulse: 75   Resp: 18   Height: 1.702 m (5\' 7" )   Weight: 61.236 kg (135 lb)   SpO2: 98%   The patient is a 44 year old woman who returns for evaluation of right  elbow pain that she notices especially when she is bending her elbows and  bringing her hand to her face to eat.  There is also soreness with lifting  activities.  She has been getting physical therapy treatments with dry  needling that has been temporarily helpful.  She has also noticed numbness  in the right ring and little fingers over this period of time as well.   Diclofenac did not help.      General health includes recent right ankle surgery for a torn ATFL and  peroneal tendon.  This was done by [facility].     GENERAL HEALTH, MEDICATIONS, ALLERGIES:    Otherwise as noted on the Epic record above.        PHYSICAL EXAMINATION:  Reveals a healthy patient in no distress.  She has satisfactory cervical  spine range of motion with some mild soreness on range of motion of her  cervical spine, but no referred pain into the arms on motion of the  cervical spine.  She has satisfactory motion of the shoulders, elbows,  wrists and hands.  There is good strength in all planes of motion about the  upper extremities.  The right elbow reveals tenderness at the lateral  epicondyle, aggravated by wrist extension against resistance.  There is  also positive Tinel sign of the ulnar nerve and perhaps slight decreased  sensation in the right ring and little fingers to touch.  Neurovascular  status otherwise intact.     IMPRESSION:  1.  Pain, right elbow.  2.  Lateral epicondylitis, right  elbow.  3.  Ulnar neuritis, right elbow.     Treatment options were discussed and it was elected to proceed with a  cortisone injection to the lateral epicondyle, tender area of right elbow.   Novocain 0.75 mL Kenalog 30 mg and 0.75 mL of 0.5% Marcaine injected  following betadine and alcohol prep immediate improvement in pain, and immediate   improvement in range of motion of the elbow bringing her hand to her face.   The patient will ice the elbow today.  She will use OTCs for symptoms.   She will continue a few more weeks of therapy and thereafter work on her exercises   on her own.  She is to avoid leaning on the elbow, to protect the ulnar nerve.    She will follow up p.r.n.

## 2016-02-17 HISTORY — PX: SHOULDER ARTHROSCOPY: SHX128

## 2017-02-16 HISTORY — PX: CARDIAC ABLATION: SHX51081

## 2017-09-27 ENCOUNTER — Encounter: Payer: Self-pay | Admitting: Cardiology

## 2017-09-27 DIAGNOSIS — I493 Ventricular premature depolarization: Secondary | ICD-10-CM | POA: Diagnosis not present

## 2017-09-27 DIAGNOSIS — I499 Cardiac arrhythmia, unspecified: Secondary | ICD-10-CM | POA: Diagnosis not present

## 2017-09-30 ENCOUNTER — Ambulatory Visit: Payer: Self-pay | Admitting: Cardiology

## 2017-10-07 DIAGNOSIS — K080 Exfoliation of teeth due to systemic causes: Secondary | ICD-10-CM | POA: Diagnosis not present

## 2017-10-12 ENCOUNTER — Encounter: Payer: Self-pay | Admitting: Cardiology

## 2017-10-12 DIAGNOSIS — I498 Other specified cardiac arrhythmias: Secondary | ICD-10-CM | POA: Diagnosis not present

## 2017-10-15 DIAGNOSIS — G5621 Lesion of ulnar nerve, right upper limb: Secondary | ICD-10-CM | POA: Diagnosis not present

## 2017-10-20 DIAGNOSIS — R002 Palpitations: Secondary | ICD-10-CM | POA: Diagnosis not present

## 2017-11-04 ENCOUNTER — Encounter: Payer: Self-pay | Admitting: Cardiology

## 2017-11-04 DIAGNOSIS — I493 Ventricular premature depolarization: Secondary | ICD-10-CM | POA: Diagnosis not present

## 2017-11-04 DIAGNOSIS — R002 Palpitations: Secondary | ICD-10-CM | POA: Diagnosis not present

## 2017-11-04 NOTE — Consult Note (Signed)
Rebecca Ellison, Rebecca Ellison  Date of visit:  09/27/2017 DOB:  05/30/1970    Age:  47 yrs. Medical record number:  [id number]     Account number:  [id number] Primary Care Provider: Sigmund HazelMILLER, LISA ____________________________ CURRENT DIAGNOSES  1. Ventricular premature depolarization ____________________________ ALLERGIES  Sulfa (Sulfonamide Antibiotics), Hives and/or rash ____________________________ MEDICATIONS  1. atenolol 25 mg tablet, 1 p.o. daily ____________________________ HISTORY OF PRESENT ILLNESS This very nice 47 year old female is seen for evaluation of PVCs. The patient previously has been in good health except for orthopedic problems. About a year and a half ago she developed some ankle as well as shoulder issues and was seen by a government hospital [place] and later was sent to the [place] [facility] where she had shoulder and elbow surgery. Evidently at that time a remark was made about her having some sort of a heart irregularity during anesthesia. She was asymptomatic and felt well following that and had an episode where she lost her voice and began to cough.  Evidently during that examination she was noted to have a slow pulse rate and an EKG showed bigeminy. [place] who did an echo that was normal and placed her on atenolol 50 mg daily and told her that she needed an ablation. This was at the in June. She had a normal ECHO at that time. She had a syncopal episode and felt poorly after that and the atenolol was reduced to 25 mg daily and [place]. She has been asymptomatic prior to the initiation of atenolol and was unaware of irregular heartbeat. She wonders about her ability to exercise. At one point she was advised to wear a monitor but none of this was never done. She has no history of hypertension and denies PND, orthopnea or edema. She has no history of palpitations and is unaware  that her heartbeat is irregular.  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 ____________________________ PAST HISTORY  Past Medical Illnesses:  no history of hypertension, diabetes, or hyperlipidemia;  Cardiovascular Illnesses:  no previous history of cardiac disease;  Infectious Diseases:  no previous history of significant infectious diseases;  Surgical Procedures:  ankle surgery, bicep/elbow repair, arthroscopic shoulder surgery-rt;  Trauma History:  no previous history of significant trauma;  NYHA Classification:  I;  Canadian Angina Classification:  Class 0: Asymptomatic;  Cardiology Procedures-Invasive:  no previous interventional or invasive cardiology procedures;  Cardiology Procedures-Noninvasive:  no previous non-invasive cardiovascular testing;  Peripheral Vascular Procedures:  no previous invasive peripheral vascular procedures.;  LVEF not documented,   ____________________________ CARDIO-PULMONARY TEST DATES EKG Date:  09/27/2017;   ____________________________ FAMILY HISTORY Father -- Medical history unknown Mother -- Medical history unknown Sister -- Armed forces training and education officerAlive and well ____________________________ SOCIAL HISTORY Alcohol Use:  wine and 2 per week;  Smoking:  never smoked;  Diet:  regular diet;  Lifestyle:  single;  Exercise:  no regular exercise;  Occupation:  Patent examinerlaw enforcement;  Residence:  lives alone;   ____________________________ REVIEW OF SYSTEMS General:  weight gain  Integumentary:history of basal cell Carcinoma Eyes: denies diplopia, history of glaucoma or visual problems. Ears, Nose, Throat, Mouth:  denies any hearing loss, epistaxis, hoarseness or difficulty speaking. Respiratory: denies  dyspnea, cough, wheezing or hemoptysis. Cardiovascular:  please review HPI Abdominal: denies  dyspepsia, GI bleeding, constipation, or diarrheaGenitourinary-Female: no dysuria, urgency, frequency, UTIs, or stress incontinence Musculoskeletal:  arthritis of the elbow Neurological:  denies headaches, stroke, or TIA Psychiatric:  denies depression or anxiety Hematological/Immunologic:  denies any food allergies, bleeding disorders. ____________________________ PHYSICAL EXAMINATION VITAL SIGNS  Blood Pressure:  106/80 Sitting, Right arm, regular cuff  , 100/80 Standing, Right arm and regular cuff   Pulse:  62/min. Weight:  146.00 lbs. Height:  66"BMI: 23  Constitutional:  pleasant white female, in no acute distress Skin:  cross tattoo in mid back Head:  normocephalic, normal hair pattern, no masses or tenderness Eyes:  EOMS Intact, PERRLA, C and S clear, Funduscopic exam not done. ENT:  ears, nose and throat reveal no gross abnormalities.  Dentition good. Neck:  supple, without massess. No JVD, thyromegaly or carotid bruits. Carotid upstroke normal. Chest:  normal symmetry, clear to auscultation. Cardiac:  regular rhythm, normal S1 and S2, No S3 or S4, no murmurs, gallops or rubs detected. Peripheral Pulses:  the femoral,dorsalis pedis, and posterior tibial pulses are full and equal bilaterally with no bruits auscultated. Extremities & Back:  no deformities, clubbing, cyanosis, erythema or edema observed. Normal muscle strength and tone. Neurological:  no gross motor or sensory deficits noted, affect appropriate, oriented x3. ____________________________ IMPRESSIONS/PLAN  1. Premature ventricular contractions the best I can tell she was asymptomatic previously currently no PVCs noted on EKG 2. Structurally normal heart by ECHO 3. Prior syncope on atenolol   Recommendations:  Long discussion and education about PVCs. I recommended that she taper off of the atenolol that she return for a standard exercise treadmill test to see whether she has any exercise induced arrhythmias and to see  what her capacity for exercise is. Following a period of washout of the beta blocker I would like for her to wear a 48 hour Holter monitor to determine what her PVC count is. At this point I think the indications for ablation are not there in that she is not symptomatic and does not appear to have any structural heart disease from PVCs. Thank you for asking me to see her with you.  12 lead EKG is normal.   ____________________________ TODAYS ORDERS  1. 12 Lead EKG: Today  2. treadmill:  Regular TM 2 weeks                       ____________________________ Cardiology Physician:  Darden Palmer MD [facility]

## 2017-11-04 NOTE — Procedures (Signed)
  Rebecca Ellison, [name]  Date of visit:  10/12/2017 DOB:  October 03, 1970    Age:  47 yrs. Medical record number:  [id number]     Account number:  [id number] Primary Care Provider: Sigmund HazelMILLER, LISA ____________________________ CURRENT DIAGNOSES  1. Ventricular premature depolarization  2. Other specified cardiac arrhythmias  3. Syncope and collapse  TREADMILL  The patient exercised on the standard Bruce protocol a total of 9 minutes into Bruce stage 3 achieving a workload of 10 METS. The test was stopped due to dyspnea and fatigue. The patient had no chest pain suggestive of angina. Heart rate rose to 185 which was 106% of predicted maximum. Blood pressure response was normal.  12-lead EKG is normal at rest. With exercise the patient achieved target heart rate and had no ST segment depression consistent with ischemia. No arrhythmias occurred and specifically no PVCs were noted at rest or during exercise.  IMPRESSIONS:  1. Clinically and EKG negative for ischemia 2. Good exercise tolerance for age  Recommendations:  The patient has a negative adequate exercise test with no arrhythmia, normal blood pressure and good exercise tolerance. She is currently off of beta blockers for PVCs and may remain off of them. I recommended that she wear a Holter monitor to check for frequency of PVCs in another week.   TODAYS ORDERS  1. Holter Monitor: 1 week                        Cardiology Physician:  Darden PalmerW. Spencer Demarkus Remmel, Jr. MD [facility]

## 2017-11-04 NOTE — Progress Notes (Addendum)
Rebecca Ellison, Rebecca Ellison  Date of visit:  11/04/2017 DOB:  06-23-1970    Age:  47 yrs. Medical record number:  [id number]     Account number:  [id number] Primary Care Provider: Sigmund HazelMILLER, LISA ____________________________ CURRENT DIAGNOSES  1. Ventricular premature depolarization  2. Other specified cardiac arrhythmias  3. Syncope and collapse ____________________________ ALLERGIES  Sulfa (Sulfonamide Antibiotics), Hives and/or rash ____________________________ MEDICATIONS  1. atenolol 25 mg tablet, 1 p.o. daily ____________________________ CHIEF COMPLAINTS  Followup of Ventricular premature depolarization ____________________________ HISTORY OF PRESENT ILLNESS Patient returns for cardiac followup. She wore  a Holter monitor that showed isolated PVCs with occasional couplets one slow triplet. 30% of her beats were PVCs. She has a previous normal treadmill test and a previous echocardi[place] that is not show any evidence of structural heart disease. She is currently off of beta blockers and feels fine at the present time. ____________________________ PAST HISTORY  Past Medical Illnesses:  no history of hypertension, diabetes, or hyperlipidemia;  Cardiovascular Illnesses:  no previous history of cardiac disease;  Infectious Diseases:  no previous history of significant infectious diseases;  Surgical Procedures:  ankle surgery, bicep/elbow repair, arthroscopic shoulder surgery-rt;  Trauma History:  no previous history of significant trauma;  NYHA Classification:  I;  Canadian Angina Classification:  Class 0: Asymptomatic;  Cardiology Procedures-Invasive:  no previous interventional or invasive cardiology procedures;  Cardiology Procedures-Noninvasive:  echocardiogram;  Peripheral Vascular Procedures:  no previous invasive peripheral vascular procedures.;  LVEF not documented,   ____________________________ CARDIO-PULMONARY TEST DATES EKG Date:  09/27/2017;    ____________________________ FAMILY HISTORY Father -- Medical history unknown Mother -- Medical history unknown Sister -- Armed forces training and education officerAlive and well ____________________________ SOCIAL HISTORY Alcohol Use:  wine and 2 per week;  Smoking:  never smoked;  Diet:  regular diet;  Lifestyle:  single;  Exercise:  exercises regularly;  Occupation:  Patent examinerlaw enforcement;  Residence:  lives alone;   ____________________________ REVIEW OF SYSTEMS General:  denies recent weight change, fatique or change in exercise tolerance.  Integumentary:history of basal cell Carcinoma Eyes: denies diplopia, history of glaucoma or visual problems. Respiratory: denies dyspnea, cough, wheezing or hemoptysis. Cardiovascular:  please review HPI Abdominal: denies dyspepsia, GI bleeding, constipation, or diarrheaGenitourinary-Female: no dysuria, urgency, frequency, UTIs, or stress incontinence Musculoskeletal:  arthritis of the elbow  ____________________________ PHYSICAL EXAMINATION VITAL SIGNS  Blood Pressure:  104/64 Sitting, Right arm, regular cuff  , 102/62 Standing, Right arm and regular cuff   Pulse:  56/min. Weight:  149.00 lbs. Height:  66.00"BMI: 24  Constitutional:  pleasant white female, in no acute distress Skin:  warm and dry to touch, no apparent skin lesions, or masses noted. Head:  normocephalic, normal hair pattern, no masses or tenderness Chest:  normal symmetry, clear to auscultation. Cardiac:  regular rhythm, normal S1 and S2, No S3 or S4, no murmurs, gallops or rubs detected. Extremities & Back:  no deformities, clubbing, cyanosis, erythema or edema observed. Normal muscle strength and tone. Neurological:  no gross motor or sensory deficits noted, affect appropriate, oriented x3. ____________________________ IMPRESSIONS/PLAN  1. 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 At the present time she is unaware of the PVCs and really feels fine and can do what she wants to do. She will like to avoid medicines if possible and we'll await the EP findings.   ____________________________ TODAYS ORDERS  1.  Consult EP: ASAP                       ____________________________ Cardiology Physician:  Darden Palmer MD [facility]

## 2017-11-09 ENCOUNTER — Encounter (INDEPENDENT_AMBULATORY_CARE_PROVIDER_SITE_OTHER): Payer: Self-pay

## 2017-11-09 ENCOUNTER — Ambulatory Visit: Payer: Federal, State, Local not specified - PPO | Admitting: Internal Medicine

## 2017-11-09 ENCOUNTER — Encounter: Payer: Self-pay | Admitting: Internal Medicine

## 2017-11-09 VITALS — BP 114/70 | HR 93 | Ht 66.5 in | Wt 150.0 lb

## 2017-11-09 DIAGNOSIS — I493 Ventricular premature depolarization: Secondary | ICD-10-CM | POA: Diagnosis not present

## 2017-11-09 DIAGNOSIS — Z23 Encounter for immunization: Secondary | ICD-10-CM | POA: Diagnosis not present

## 2017-11-09 NOTE — Progress Notes (Signed)
HPI Rebecca Ellison is referred today by Dr. Donnie Ahoilley for evaluation of PVC's. She is a minimally symptomatic but otherwise healthy 47 yo woman who works for [employer] and [place] The patient was found to have PVC's after seeking medical attention. She has minimal palpitations. Holter monitor demonstrated 30% PVC's. 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 There is an inferior axis. She does not have syncope. She thinks she is a bit more tired than usual. Not on File   No current outpatient medications on file.   No current facility-administered medications for this visit.      History reviewed. No pertinent past medical history.  ROS:   All systems reviewed and negative except as noted in the HPI.   History reviewed. No pertinent surgical history.   History reviewed. No pertinent family history. No premature SCD or arrhythmias   Social History   Socioeconomic History  . Marital status: Single    Spouse name: Not on file  . Number of children: Not on file  . Years of education: Not on file  . Highest education level: Not on file  Occupational History  . Not on file  Social Needs  . Financial resource strain: Not on file  . Food insecurity:    Worry: Not on file    Inability: Not on file  . Transportation needs:    Medical: Not on file    Non-medical: Not on file  Tobacco Use  . Smoking status: Never Smoker  . Smokeless tobacco: Never Used  Substance and Sexual Activity  . Alcohol use: Not on file  . Drug use: Not on file  . Sexual activity: Not on file  Lifestyle  . Physical activity:    Days per week: Not on file    Minutes per session: Not on file  . Stress: Not on file  Relationships  . Social connections:    Talks on phone: Not on file    Gets together: Not on file    Attends religious service: Not on file    Active member of  club or organization: Not on file    Attends meetings of clubs or organizations: Not on file    Relationship status: Not on file  . Intimate partner violence:    Fear of current or ex partner: Not on file    Emotionally abused: Not on file    Physically abused: Not on file    Forced sexual activity: Not on file  Other Topics Concern  . Not on file  Social History Narrative  . Not on file     BP 114/70   Pulse 93   Ht 5' 6.5" (1.689 m)   Wt 150 lb (68 kg)   SpO2 98%   BMI 23.85 kg/m   Physical Exam:  Well appearing NAD HEENT: Unremarkable Neck:  No JVD, no thyromegally Lymphatics:  No adenopathy Back:  No CVA tenderness Lungs:  Clear HEART:  Regular rate rhythm, no murmurs, no rubs, no clicks Abd:  soft, positive bowel sounds, no organomegally, no rebound, no guarding Ext:  2 plus pulses, no edema, no cyanosis, no clubbing Skin:  No rashes no nodules Neuro:  CN II through XII intact, motor grossly intact  EKG - NSR with frequent PVC's   Assess/Plan: 1. PVC's - she is symptomatic but minimally so. I am  concerned about the development of a tachy induced CM with 30% PVC burden.  The patient was treated with 4 different AA drugs while in [place] on gov assignment. I have reviewed the treatment options and recommended proceeding with EPS/RFA of PVC's. She will call us if she wishes to proceed. We will use CARTO.  Rebecca Ellison.D.

## 2017-11-09 NOTE — Patient Instructions (Addendum)
Medication Instructions:  Your physician recommends that you continue on your current medications as directed. Please refer to the Current Medication list given to you today.  Labwork: None ordered.  Testing/Procedures: Your physician has recommended that you have an ablation. Catheter ablation is a medical procedure used to treat some cardiac arrhythmias (irregular heartbeats). During catheter ablation, a long, thin, flexible tube is put into a blood vessel in your groin (upper thigh), or neck. This tube is called an ablation catheter. It is then guided to your heart through the blood vessel. Radio frequency waves destroy small areas of heart tissue where abnormal heartbeats may cause an arrhythmia to start. Please see the instruction sheet given to you today.  The following days are available for procedures:  If you decide on a day please give me a call:  Dierdre HighmanJenny RN [phone]  Ablation days:  October 4, 7, 28 and 29 November 4, 15, 18, 25   Cardiac Ablation Cardiac ablation is a procedure to disable (ablate) a small amount of heart tissue in very specific places. The heart has many electrical connections. Sometimes these connections are abnormal and can cause the heart to beat very fast or irregularly. Ablating some of the problem areas can improve the heart rhythm or return it to normal. Ablation may be done for people who:  Have Wolff-Parkinson-White syndrome.  Have fast heart rhythms (tachycardia).  Have taken medicines for an abnormal heart rhythm (arrhythmia) that were not effective or caused side effects.  Have a high-risk heartbeat that may be life-threatening.  During the procedure, a small incision is made in the neck or the groin, and a long, thin, flexible tube (catheter) is inserted into the incision and moved to the heart. Small devices (electrodes) on the tip of the catheter will send out electrical currents. A type of X-ray (fluoroscopy) will be used to help guide the  catheter and to provide images of the heart. Tell a health care provider about:  Any allergies you have.  All medicines you are taking, including vitamins, herbs, eye drops, creams, and over-the-counter medicines.  Any problems you or family members have had with anesthetic medicines.  Any blood disorders you have.  Any surgeries you have had.  Any medical conditions you have, such as kidney failure.  Whether you are pregnant or may be pregnant. What are the risks? Generally, this is a safe procedure. However, problems may occur, including:  Infection.  Bruising and bleeding at the catheter insertion site.  Bleeding into the chest, especially into the sac that surrounds the heart. This is a serious complication.  Stroke or blood clots.  Damage to other structures or organs.  Allergic reaction to medicines or dyes.  Need for a permanent pacemaker if the normal electrical system is damaged. A pacemaker is a small computer that sends electrical signals to the heart and helps your heart beat normally.  The procedure not being fully effective. This may not be recognized until months later. Repeat ablation procedures are sometimes required.  What happens before the procedure?  Follow instructions from your health care provider about eating or drinking restrictions.  Ask your health care provider about: ? Changing or stopping your regular medicines. This is especially important if you are taking diabetes medicines or blood thinners. ? Taking medicines such as aspirin and ibuprofen. These medicines can thin your blood. Do not take these medicines before your procedure if your health care provider instructs you not to.  Plan to have someone take you  home from the hospital or clinic.  If you will be going home right after the procedure, plan to have someone with you for 24 hours. What happens during the procedure?  To lower your risk of infection: ? Your health care team will  wash or sanitize their hands. ? Your skin will be washed with soap. ? Hair may be removed from the incision area.  An IV tube will be inserted into one of your veins.  You will be given a medicine to help you relax (sedative).  The skin on your neck or groin will be numbed.  An incision will be made in your neck or your groin.  A needle will be inserted through the incision and into a large vein in your neck or groin.  A catheter will be inserted into the needle and moved to your heart.  Dye may be injected through the catheter to help your surgeon see the area of the heart that needs treatment.  Electrical currents will be sent from the catheter to ablate heart tissue in desired areas. There are three types of energy that may be used to ablate heart tissue: ? Heat (radiofrequency energy). ? Laser energy. ? Extreme cold (cryoablation).  When the necessary tissue has been ablated, the catheter will be removed.  Pressure will be held on the catheter insertion area to prevent excessive bleeding.  A bandage (dressing) will be placed over the catheter insertion area. The procedure may vary among health care providers and hospitals. What happens after the procedure?  Your blood pressure, heart rate, breathing rate, and blood oxygen level will be monitored until the medicines you were given have worn off.  Your catheter insertion area will be monitored for bleeding. You will need to lie still for a few hours to ensure that you do not bleed from the catheter insertion area.  Do not drive for 24 hours or as long as directed by your health care provider. Summary  Cardiac ablation is a procedure to disable (ablate) a small amount of heart tissue in very specific places. Ablating some of the problem areas can improve the heart rhythm or return it to normal.  During the procedure, electrical currents will be sent from the catheter to ablate heart tissue in desired areas. This information  is not intended to replace advice given to you by your health care provider. Make sure you discuss any questions you have with your health care provider. Document Released: 06/21/2008 Document Revised: 12/23/2015 Document Reviewed: 12/23/2015 Elsevier Interactive Patient Education  [employer].

## 2017-11-10 ENCOUNTER — Ambulatory Visit: Payer: Self-pay | Admitting: Cardiology

## 2017-11-10 ENCOUNTER — Encounter

## 2017-11-12 ENCOUNTER — Telehealth: Payer: Self-pay | Admitting: Internal Medicine

## 2017-11-12 DIAGNOSIS — I493 Ventricular premature depolarization: Secondary | ICD-10-CM

## 2017-11-12 NOTE — Telephone Encounter (Signed)
° °  Patient calling to schedule ablation. Please call

## 2017-11-15 NOTE — Telephone Encounter (Signed)
Follow Up:     Pt called, please call.

## 2017-11-16 NOTE — Telephone Encounter (Signed)
Discussed with Pt.  See MyChart for records.  Pt scheduled/labs scheduled.  Will send instruction letter.  No further action at this time.

## 2017-11-17 DIAGNOSIS — R200 Anesthesia of skin: Secondary | ICD-10-CM | POA: Diagnosis not present

## 2017-11-25 DIAGNOSIS — Z01419 Encounter for gynecological examination (general) (routine) without abnormal findings: Secondary | ICD-10-CM | POA: Diagnosis not present

## 2017-11-25 DIAGNOSIS — Z30013 Encounter for initial prescription of injectable contraceptive: Secondary | ICD-10-CM | POA: Diagnosis not present

## 2017-11-25 DIAGNOSIS — Z3202 Encounter for pregnancy test, result negative: Secondary | ICD-10-CM | POA: Diagnosis not present

## 2017-12-03 DIAGNOSIS — M7711 Lateral epicondylitis, right elbow: Secondary | ICD-10-CM | POA: Diagnosis not present

## 2017-12-13 ENCOUNTER — Other Ambulatory Visit: Payer: Federal, State, Local not specified - PPO

## 2017-12-14 ENCOUNTER — Other Ambulatory Visit: Payer: Federal, State, Local not specified - PPO

## 2017-12-14 DIAGNOSIS — I493 Ventricular premature depolarization: Secondary | ICD-10-CM

## 2017-12-14 LAB — CBC WITH DIFFERENTIAL/PLATELET
BASOS: 1 %
Basophils Absolute: 0.1 10*3/uL (ref 0.0–0.2)
EOS (ABSOLUTE): 0.1 10*3/uL (ref 0.0–0.4)
EOS: 1 %
HEMATOCRIT: 44.4 % (ref 34.0–46.6)
HEMOGLOBIN: 15 g/dL (ref 11.1–15.9)
IMMATURE GRANS (ABS): 0 10*3/uL (ref 0.0–0.1)
IMMATURE GRANULOCYTES: 0 %
LYMPHS: 35 %
Lymphocytes Absolute: 2.8 10*3/uL (ref 0.7–3.1)
MCH: 31.6 pg (ref 26.6–33.0)
MCHC: 33.8 g/dL (ref 31.5–35.7)
MCV: 94 fL (ref 79–97)
MONOCYTES: 7 %
MONOS ABS: 0.6 10*3/uL (ref 0.1–0.9)
NEUTROS PCT: 56 %
Neutrophils Absolute: 4.3 10*3/uL (ref 1.4–7.0)
Platelets: 388 10*3/uL (ref 150–450)
RBC: 4.74 x10E6/uL (ref 3.77–5.28)
RDW: 11.9 % — ABNORMAL LOW (ref 12.3–15.4)
WBC: 7.8 10*3/uL (ref 3.4–10.8)

## 2017-12-14 LAB — BASIC METABOLIC PANEL
BUN/Creatinine Ratio: 11 (ref 9–23)
BUN: 10 mg/dL (ref 6–24)
CO2: 19 mmol/L — AB (ref 20–29)
CREATININE: 0.95 mg/dL (ref 0.57–1.00)
Calcium: 9.4 mg/dL (ref 8.7–10.2)
Chloride: 101 mmol/L (ref 96–106)
GFR calc Af Amer: 83 mL/min/{1.73_m2} (ref >59)
GFR, EST NON AFRICAN AMERICAN: 72 mL/min/{1.73_m2} (ref >59)
Glucose: 83 mg/dL (ref 65–99)
Potassium: 4.9 mmol/L (ref 3.5–5.2)
Sodium: 136 mmol/L (ref 134–144)

## 2018-01-03 ENCOUNTER — Ambulatory Visit (HOSPITAL_COMMUNITY)
Admission: RE | Disposition: A | Discharge status: Home / Self Care | Payer: Self-pay | Source: Ambulatory Visit | Attending: Internal Medicine

## 2018-01-03 ENCOUNTER — Ambulatory Visit (HOSPITAL_COMMUNITY)
Admission: RE | Admit: 2018-01-03 | Discharge: 2018-01-03 | Disposition: A | Discharge status: Home / Self Care | Payer: Federal, State, Local not specified - PPO | Source: Ambulatory Visit | Attending: Internal Medicine | Admitting: Internal Medicine

## 2018-01-03 DIAGNOSIS — I493 Ventricular premature depolarization: Secondary | ICD-10-CM | POA: Diagnosis not present

## 2018-01-03 DIAGNOSIS — R002 Palpitations: Secondary | ICD-10-CM | POA: Diagnosis not present

## 2018-01-03 HISTORY — PX: PVC ABLATION: EP1236

## 2018-01-03 LAB — PREGNANCY, URINE: Preg Test, Ur: NEGATIVE

## 2018-01-03 SURGERY — PVC ABLATION

## 2018-01-03 MED ORDER — BUPIVACAINE HCL (PF) 0.25 % IJ SOLN
INTRAMUSCULAR | Status: AC
  Filled 2018-01-03: qty 60

## 2018-01-03 MED ORDER — SODIUM CHLORIDE 0.9% FLUSH: 3.0000 mL | INTRAVENOUS | Status: DC | PRN

## 2018-01-03 MED ORDER — MIDAZOLAM HCL 5 MG/5ML IJ SOLN
INTRAMUSCULAR | Status: AC
  Filled 2018-01-03: qty 5

## 2018-01-03 MED ORDER — ONDANSETRON HCL 4 MG/2ML IJ SOLN: 4.0000 mg | Freq: Four times a day (QID) | INTRAMUSCULAR | Status: DC | PRN

## 2018-01-03 MED ORDER — HEPARIN (PORCINE) IN NACL 1000-0.9 UT/500ML-% IV SOLN
INTRAVENOUS | Status: AC
  Filled 2018-01-03: qty 500

## 2018-01-03 MED ORDER — HEPARIN (PORCINE) IN NACL 1000-0.9 UT/500ML-% IV SOLN
INTRAVENOUS | Status: DC | PRN
  Administered 2018-01-03: 500 mL

## 2018-01-03 MED ORDER — SODIUM CHLORIDE 0.9 % IV SOLN: 250.0000 mL | INTRAVENOUS | Status: DC | PRN

## 2018-01-03 MED ORDER — BUPIVACAINE HCL (PF) 0.25 % IJ SOLN
INTRAMUSCULAR | Status: DC | PRN
  Administered 2018-01-03: 45 mL

## 2018-01-03 MED ORDER — FENTANYL CITRATE (PF) 100 MCG/2ML IJ SOLN
INTRAMUSCULAR | Status: DC | PRN
  Administered 2018-01-03 (×8): 12.5 ug via INTRAVENOUS

## 2018-01-03 MED ORDER — MIDAZOLAM HCL 5 MG/5ML IJ SOLN
INTRAMUSCULAR | Status: DC | PRN
  Administered 2018-01-03 (×7): 1 mg via INTRAVENOUS

## 2018-01-03 MED ORDER — SODIUM CHLORIDE 0.9% FLUSH: 3.0000 mL | Freq: Two times a day (BID) | INTRAVENOUS | Status: DC

## 2018-01-03 MED ORDER — SODIUM CHLORIDE 0.9 % IV SOLN
INTRAVENOUS | Status: DC
  Administered 2018-01-03: 06:00:00 via INTRAVENOUS

## 2018-01-03 MED ORDER — FENTANYL CITRATE (PF) 100 MCG/2ML IJ SOLN
INTRAMUSCULAR | Status: AC
  Filled 2018-01-03: qty 2

## 2018-01-03 MED ORDER — ACETAMINOPHEN 325 MG PO TABS
650.0000 mg | ORAL_TABLET | ORAL | Status: DC | PRN
  Filled 2018-01-03: qty 2

## 2018-01-03 SURGICAL SUPPLY — 10 items
BAG SNAP BAND KOVER 36X36 (MISCELLANEOUS) ×2 IMPLANT
CATH EZ STEER NAV 4MM D-F CUR (ABLATOR) ×2 IMPLANT
CATH JOSEPHSON QUAD-ALLRED 6FR (CATHETERS) ×4 IMPLANT
PACK EP LATEX FREE (CUSTOM PROCEDURE TRAY) ×1
PACK EP LF (CUSTOM PROCEDURE TRAY) ×1 IMPLANT
PAD PRO RADIOLUCENT 2001M-C (PAD) ×2 IMPLANT
PATCH CARTO3 (PAD) ×2 IMPLANT
SHEATH PINNACLE 6F 10CM (SHEATH) ×4 IMPLANT
SHEATH PINNACLE 8F 10CM (SHEATH) ×2 IMPLANT
SHIELD RADPAD SCOOP 12X17 (MISCELLANEOUS) ×2 IMPLANT

## 2018-01-03 NOTE — H&P (Signed)
HPI [name] is referred today by Dr. Donnie Ahoilley for evaluation of PVC's. She is a minimally symptomatic but otherwise healthy 47 yo woman who works for [employer] and [place] The patient was found to have PVC's after seeking medical attention. She has minimal palpitations. Holter monitor demonstrated 30% PVC's. 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 There is an inferior axis. She does not have syncope. She thinks she is a bit more tired than usual. Not on File   No current outpatient medications on file.   No current facility-administered medications for this visit.      History reviewed. No pertinent past medical history.  ROS:   All systems reviewed and negative except as noted in the HPI.   History reviewed. No pertinent surgical history.   History reviewed. No pertinent family history. No premature SCD or arrhythmias   Social History   Socioeconomic History  . Marital status: Single    Spouse name: Not on file  . Number of children: Not on file  . Years of education: Not on file  . Highest education level: Not on file  Occupational History  . Not on file  Social Needs  . Financial resource strain: Not on file  . Food insecurity:    Worry: Not on file    Inability: Not on file  . Transportation needs:    Medical: Not on file    Non-medical: Not on file  Tobacco Use  . Smoking status: Never Smoker  . Smokeless tobacco: Never Used  Substance and Sexual Activity  . Alcohol use: Not on file  . Drug use: Not on file  . Sexual activity: Not on file  Lifestyle  . Physical activity:    Days per week: Not on file    Minutes per session: Not on file  . Stress: Not on file  Relationships  . Social connections:    Talks on phone: Not on file    Gets together: Not on file    Attends religious  service: Not on file    Active member of club or organization: Not on file    Attends meetings of clubs or organizations: Not on file    Relationship status: Not on file  . Intimate partner violence:    Fear of current or ex partner: Not on file    Emotionally abused: Not on file    Physically abused: Not on file    Forced sexual activity: Not on file  Other Topics Concern  . Not on file  Social History Narrative  . Not on file     BP 114/70   Pulse 93   Ht 5' 6.5" (1.689 m)   Wt 150 lb (68 kg)   SpO2 98%   BMI 23.85 kg/m   Physical Exam:  Well appearing NAD HEENT: Unremarkable Neck:  No JVD, no thyromegally Lymphatics:  No adenopathy Back:  No CVA tenderness Lungs:  Clear HEART:  Regular rate rhythm, no murmurs, no rubs, no clicks Abd:  soft, positive bowel sounds, no organomegally, no rebound, no guarding Ext:  2 plus pulses, no edema, no cyanosis, no clubbing Skin:  No rashes no nodules Neuro:  CN II through XII intact, motor grossly intact  EKG - NSR with frequent PVC's   Assess/Plan: 1. PVC's - she is symptomatic but minimally so. I  am concerned about the development of a tachy induced CM with 30% PVC burden.  The patient was treated with 4 different AA drugs while in [place] on gov assignment. I have reviewed the treatment options and recommended proceeding with EPS/RFA of PVC's. She will call us if she wishes to proceed. We will use CARTO.  Rebecca Ellison.  EP Attending Patient seen and examined. Agree with a bove. No change since her last visit. We will undergo PVC ablation.   Rebecca Ellison.D.

## 2018-01-03 NOTE — Progress Notes (Addendum)
Site area: rt groin 3 fv sheaths Site Prior to Removal:  Level 0 Pressure Applied For: 15 minutes Manual:   yes Patient Status During Pull:  stable Post Pull Site:  Level 0 Post Pull Instructions Given:  yes Post Pull Pulses Present:  Rt dp palpable Dressing Applied:  Gauze and tegaderm Bedrest begins @ 1020 Comments: iv saline locked

## 2018-01-03 NOTE — Discharge Instructions (Signed)
Cardiac Ablation, Care After °This sheet gives you information about how to care for yourself after your procedure. Your health care provider may also give you more specific instructions. If you have problems or questions, contact your health care provider. °What can I expect after the procedure? °After the procedure, it is common to have: °· Bruising around your puncture site. °· Tenderness around your puncture site. °· Skipped heartbeats. °· Tiredness (fatigue). ° °Follow these instructions at home: °Puncture site care °· Follow instructions from your health care provider about how to take care of your puncture site. Make sure you: °? Wash your hands with soap and water before you change your bandage (dressing). If soap and water are not available, use hand sanitizer. °? Change your dressing as told by your health care provider. °? Leave stitches (sutures), skin glue, or adhesive strips in place. These skin closures may need to stay in place for up to 2 weeks. If adhesive strip edges start to loosen and curl up, you may trim the loose edges. Do not remove adhesive strips completely unless your health care provider tells you to do that. °· Check your puncture site every day for signs of infection. Check for: °? Redness, swelling, or pain. °? Fluid or blood. If your puncture site starts to bleed, lie down on your back, apply firm pressure to the area, and contact your health care provider. °? Warmth. °? Pus or a bad smell. °Driving °· Ask your health care provider when it is safe for you to drive again after the procedure. °· Do not drive or use heavy machinery while taking prescription pain medicine. °· Do not drive for 24 hours if you were given a medicine to help you relax (sedative) during your procedure. °Activity °· Avoid activities that take a lot of effort for at least 3 days after your procedure. °· Do not lift anything that is heavier than 10 lb (4.5 kg), or the limit that you are told, until your health  care provider says that it is safe. °· Return to your normal activities as told by your health care provider. Ask your health care provider what activities are safe for you. °General instructions °· Take over-the-counter and prescription medicines only as told by your health care provider. °· Do not use any products that contain nicotine or tobacco, such as cigarettes and e-cigarettes. If you need help quitting, ask your health care provider. °· Do not take baths, swim, or use a hot tub until your health care provider approves. °· Do not drink alcohol for 24 hours after your procedure. °· Keep all follow-up visits as told by your health care provider. This is important. °Contact a health care provider if: °· You have redness, mild swelling, or pain around your puncture site. °· You have fluid or blood coming from your puncture site that stops after applying firm pressure to the area. °· Your puncture site feels warm to the touch. °· You have pus or a bad smell coming from your puncture site. °· You have a fever. °· You have chest pain or discomfort that spreads to your neck, jaw, or arm. °· You are sweating a lot. °· You feel nauseous. °· You have a fast or irregular heartbeat. °· You have shortness of breath. °· You are dizzy or light-headed and feel the need to lie down. °· You have pain or numbness in the arm or leg closest to your puncture site. °Get help right away if: °· Your puncture   site suddenly swells. °· Your puncture site is bleeding and the bleeding does not stop after applying firm pressure to the area. °These symptoms may represent a serious problem that is an emergency. Do not wait to see if the symptoms will go away. Get medical help right away. Call your local emergency services (911 in the [place] Do not drive yourself to the hospital. °Summary °· After the procedure, it is normal to have bruising and tenderness at the puncture site in your groin, neck, or forearm. °· Check your puncture site every  day for signs of infection. °· Get help right away if your puncture site is bleeding and the bleeding does not stop after applying firm pressure to the area. This is a medical emergency. °This information is not intended to replace advice given to you by your health care provider. Make sure you discuss any questions you have with your health care provider. °Document Released: 05/14/2016 Document Revised: 05/14/2016 Document Reviewed: 05/14/2016 °Elsevier Interactive Patient Education © 2018 Elsevier Inc. ° °

## 2018-01-04 ENCOUNTER — Encounter (HOSPITAL_COMMUNITY): Payer: Self-pay | Admitting: Internal Medicine

## 2018-01-04 MED FILL — Fentanyl Citrate Preservative Free (PF) Inj 100 MCG/2ML: INTRAMUSCULAR | Qty: 2 | Status: AC

## 2018-01-26 DIAGNOSIS — L309 Dermatitis, unspecified: Secondary | ICD-10-CM | POA: Diagnosis not present

## 2018-01-26 DIAGNOSIS — Z8679 Personal history of other diseases of the circulatory system: Secondary | ICD-10-CM | POA: Diagnosis not present

## 2018-01-26 DIAGNOSIS — Z9889 Other specified postprocedural states: Secondary | ICD-10-CM | POA: Diagnosis not present

## 2018-02-01 ENCOUNTER — Ambulatory Visit: Payer: Federal, State, Local not specified - PPO | Admitting: Internal Medicine

## 2018-02-01 ENCOUNTER — Encounter: Payer: Self-pay | Admitting: Internal Medicine

## 2018-02-01 VITALS — BP 116/78 | HR 79 | Ht 67.0 in | Wt 150.0 lb

## 2018-02-01 DIAGNOSIS — I493 Ventricular premature depolarization: Secondary | ICD-10-CM

## 2018-02-01 NOTE — Patient Instructions (Addendum)
Medication Instructions:  Your physician recommends that you continue on your current medications as directed. Please refer to the Current Medication list given to you today.  Labwork: None ordered.  Testing/Procedures: None ordered.  Follow-Up: Your physician wants you to follow-up in: as needed with Dr. Taylor.      Any Other Special Instructions Will Be Listed Below (If Applicable).  If you need a refill on your cardiac medications before your next appointment, please call your pharmacy.   

## 2018-02-01 NOTE — Progress Notes (Signed)
HPI Rebecca Ellison returns today for followup. 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 In the interim, she has done well. She denies chest pain, sob, or palpitations.  Allergies  Allergen Reactions  . Sulfa Antibiotics Hives     Current Outpatient Medications  Medication Sig Dispense Refill  . calcium carbonate (TUMS - DOSED IN MG ELEMENTAL CALCIUM) 500 MG chewable tablet Chew 2 tablets by mouth daily as needed for indigestion or heartburn.    . hydrocortisone 2.5 % cream Apply 1 application topically daily as needed (eczema).    . medroxyPROGESTERone (DEPO-PROVERA) 150 MG/ML injection Inject 150 mg into the muscle every 3 (three) months.  4   No current facility-administered medications for this visit.      History reviewed. No pertinent past medical history.  ROS:   All systems reviewed and negative except as noted in the HPI.   Past Surgical History:  Procedure Laterality Date  . PVC ABLATION N/A 01/03/2018   Procedure: PVC ABLATION;  Surgeon: Marinus Mawaylor, Meer Reindl W, MD;  [facility];  Service: Cardiovascular;  Laterality: N/A;     History reviewed. No pertinent family history.   Social History   Socioeconomic History  . Marital status: Single    Spouse name: Not on file  . Number of children: Not on file  . Years of education: Not on file  . Highest education level: Not on file  Occupational History  . Not on file  Social Needs  . Financial resource strain: Not on file  . Food insecurity:    Worry: Not on file    Inability: Not on file  . Transportation needs:    Medical: Not on file    Non-medical: Not on file  Tobacco Use  . Smoking status: Never Smoker  . Smokeless tobacco: Never Used  Substance and Sexual Activity  . Alcohol use: Not on file  . Drug use: Not on file  . Sexual activity: Not on file    Lifestyle  . Physical activity:    Days per week: Not on file    Minutes per session: Not on file  . Stress: Not on file  Relationships  . Social connections:    Talks on phone: Not on file    Gets together: Not on file    Attends religious service: Not on file    Active member of club or organization: Not on file    Attends meetings of clubs or organizations: Not on file    Relationship status: Not on file  . Intimate partner violence:    Fear of current or ex partner: Not on file    Emotionally abused: Not on file    Physically abused: Not on file    Forced sexual activity: Not on file  Other Topics Concern  . Not on file  Social History Narrative  . Not on file     BP 116/78   Pulse 79   Ht 5\' 7"  (1.702 m)   Wt 150 lb (68 kg)   SpO2 97%   BMI 23.49 kg/m   Physical Exam:  Well appearing NAD HEENT: Unremarkable Neck:  No JVD, no thyromegally Lymphatics:  No adenopathy Back:  No CVA tenderness Lungs:  Clear with no wheezes HEART:  Regular rate rhythm, no murmurs, no rubs, no clicks Abd:  soft, positive  bowel sounds, no organomegally, no rebound, no guarding Ext:  2 plus pulses, no edema, no cyanosis, no clubbing Skin:  No rashes no nodules Neuro:  CN II through XII intact, motor grossly intact  EKG - nsr   Assess/Plan: 1. PVC' s - she is s/p EPS/RFA of her PVC's and these have resolved. She will undergo watchful waiting.   Leonia Reeves.D.

## 2018-02-02 ENCOUNTER — Other Ambulatory Visit: Payer: Self-pay | Admitting: Obstetrics & Gynecology

## 2018-02-02 DIAGNOSIS — R922 Inconclusive mammogram: Secondary | ICD-10-CM

## 2018-02-11 ENCOUNTER — Ambulatory Visit
Admission: RE | Admit: 2018-02-11 | Discharge: 2018-02-11 | Disposition: A | Discharge status: Home / Self Care | Payer: Federal, State, Local not specified - PPO | Source: Ambulatory Visit | Attending: Obstetrics & Gynecology | Admitting: Obstetrics & Gynecology

## 2018-02-11 DIAGNOSIS — R922 Inconclusive mammogram: Secondary | ICD-10-CM

## 2018-02-11 DIAGNOSIS — N6002 Solitary cyst of left breast: Secondary | ICD-10-CM | POA: Diagnosis not present

## 2018-02-11 DIAGNOSIS — N6001 Solitary cyst of right breast: Secondary | ICD-10-CM | POA: Diagnosis not present

## 2018-02-14 DIAGNOSIS — Z3042 Encounter for surveillance of injectable contraceptive: Secondary | ICD-10-CM | POA: Diagnosis not present

## 2018-02-16 HISTORY — PX: KNEE ARTHROSCOPY: SUR90

## 2018-03-24 DIAGNOSIS — D2262 Melanocytic nevi of left upper limb, including shoulder: Secondary | ICD-10-CM | POA: Diagnosis not present

## 2018-03-24 DIAGNOSIS — D225 Melanocytic nevi of trunk: Secondary | ICD-10-CM | POA: Diagnosis not present

## 2018-03-24 DIAGNOSIS — D2261 Melanocytic nevi of right upper limb, including shoulder: Secondary | ICD-10-CM | POA: Diagnosis not present

## 2018-03-24 DIAGNOSIS — Z85828 Personal history of other malignant neoplasm of skin: Secondary | ICD-10-CM | POA: Diagnosis not present

## 2018-04-11 ENCOUNTER — Telehealth: Payer: Self-pay | Admitting: Internal Medicine

## 2018-04-11 DIAGNOSIS — R0789 Other chest pain: Secondary | ICD-10-CM

## 2018-04-11 DIAGNOSIS — I493 Ventricular premature depolarization: Secondary | ICD-10-CM

## 2018-04-11 NOTE — Telephone Encounter (Signed)
New Message   STAT if patient feels like he/she is going to faint   1) Are you dizzy now? Not now   2) Do you feel faint or have you passed out? Have felt faint, has had to sit down and close her eyes   3) Do you have any other symptoms? Lightheaded, disoriented, silver things flying, she has had to stop and lay down. She says this happened 3xs in the last 2 weeks   4) Have you checked your HR and BP (record if available)? No

## 2018-04-11 NOTE — Telephone Encounter (Signed)
Called and spoke to patient. She states that for the past 2 weeks that she has been stressed at work and has been very anxious. She states that she has had intermittent episodes of dizziness where she sees sliver things flying and she has to close her eyes and lie down. She denies syncope. She states that the last episode happened when she was playing with her dog, but can happen at rest as well. She states that she also has intermittent episodes of "fullness" in her left chest just under her breast. She states that this can occur with activity or rest. She states that she takes a few deep breaths and it resolves on its own. Patient s/p PVC ablation. She denies having any palpitations or irregular beats. She does not have any vitals to offer and she is not on any meds. Patient denies having any Sx at this time. Will forward to Dr. Lubertha Basque RN to make aware.

## 2018-04-15 NOTE — Telephone Encounter (Signed)
Returned call to Pt.  See MyChart message.    Advised Pt should receive a call soon to schedule stress test.  Pt thanked nurse for call back.

## 2018-04-19 DIAGNOSIS — K080 Exfoliation of teeth due to systemic causes: Secondary | ICD-10-CM | POA: Diagnosis not present

## 2018-04-19 DIAGNOSIS — F419 Anxiety disorder, unspecified: Secondary | ICD-10-CM | POA: Diagnosis not present

## 2018-04-21 ENCOUNTER — Telehealth: Payer: Self-pay | Admitting: Internal Medicine

## 2018-04-21 NOTE — Telephone Encounter (Signed)
  Dr Zachery Dauer saw patient this week for anxiety and would like to put her on Lexapro with her heart issues she would like to make sure Dr Ladona Ridgel is okay with the patient being on Lexapro. If not appropriate, could Dr Ladona Ridgel recommend something?

## 2018-04-21 NOTE — Telephone Encounter (Signed)
Returned call to Dr. Zachery Dauer office.  Advised per Dr. Ladona Ridgel- ok for Pt to take lexapro.

## 2018-05-05 ENCOUNTER — Telehealth: Payer: Self-pay

## 2018-05-05 NOTE — Telephone Encounter (Addendum)
After reviewing patient's chart Dr. Nishan recommends patient's GXT be rescheduled at 6 to 8 weeks from original appointment, due to COVID 19 precautions. PCC will call patient to reschedule. 

## 2018-05-09 NOTE — Telephone Encounter (Signed)
GXT resc to 06-23-18.

## 2018-05-19 DIAGNOSIS — F419 Anxiety disorder, unspecified: Secondary | ICD-10-CM | POA: Diagnosis not present

## 2018-05-19 DIAGNOSIS — R770 Abnormality of albumin: Secondary | ICD-10-CM | POA: Diagnosis not present

## 2018-05-23 DIAGNOSIS — Z3042 Encounter for surveillance of injectable contraceptive: Secondary | ICD-10-CM | POA: Diagnosis not present

## 2018-05-23 DIAGNOSIS — Z3202 Encounter for pregnancy test, result negative: Secondary | ICD-10-CM | POA: Diagnosis not present

## 2018-06-18 DIAGNOSIS — S90861A Insect bite (nonvenomous), right foot, initial encounter: Secondary | ICD-10-CM | POA: Diagnosis not present

## 2018-07-18 ENCOUNTER — Ambulatory Visit: Payer: Federal, State, Local not specified - PPO | Admitting: Orthopaedic Surgery

## 2018-07-27 ENCOUNTER — Ambulatory Visit (INDEPENDENT_AMBULATORY_CARE_PROVIDER_SITE_OTHER): Payer: Federal, State, Local not specified - PPO | Admitting: Orthopaedic Surgery

## 2018-07-27 ENCOUNTER — Other Ambulatory Visit: Payer: Self-pay

## 2018-07-27 ENCOUNTER — Ambulatory Visit: Payer: Self-pay

## 2018-07-27 ENCOUNTER — Encounter: Payer: Self-pay | Admitting: Orthopaedic Surgery

## 2018-07-27 DIAGNOSIS — M25552 Pain in left hip: Secondary | ICD-10-CM | POA: Diagnosis not present

## 2018-07-27 NOTE — Progress Notes (Signed)
Office Visit Note   Patient: Rebecca Ellison           Date of Birth: Jul 28, 1970           MRN: [id number] Visit Date: 07/27/2018              Requested by: Kathyrn Lass, [place], [place] [address] PCP: Kathyrn Lass, MD   Assessment & Plan: Visit Diagnoses:  1. Pain of left hip joint     Plan: Due to the fact that patient continues to have left hip pain despite time, NSAIDs, activity modification and given her radiographs today recommend MRI of the left pelvis to evaluate for internal derangement.  She will follow-up with [place] after the MRI to go over results discuss further treatment.  Follow-Up Instructions: Return for After MRI.   Orders:  Orders Placed This Encounter  Procedures  . XR Pelvis 1-2 Views   No orders of the defined types were placed in this encounter.     Procedures: No procedures performed   Clinical Data: No additional findings.   Subjective: Chief Complaint  Patient presents with  . Left Hip - Pain    HPI Patient 48 year old female with left hip pain that began in February.  She states she was running and had severe pain lateral aspect of the hip.  She denies any groin pain.  She denies any radicular symptoms down either leg.  No prior hip pain.  No particular injury to the hip.  Pain is worse with sitting for long period of time.  She states she has had to cut back on her exercise particularly running due to the pain.  She had some leftover pain medication from shoulder surgery which she took is helped some.  She also took some over-the-counter NSAIDs which helped but she is worried about stomach upset with this.  History of PVCs with a ambulation.  Denies any snapping or popping within the left hip.  Review of Systems See HPI otherwise negative or non-contributory.   Objective: Vital Signs: There were no vitals taken for this visit.  Physical Exam Constitutional:      Appearance: She is normal weight. She is not  ill-appearing or diaphoretic.  Pulmonary:     Effort: Pulmonary effort is normal.  Neurological:     Mental Status: She is alert and oriented to person, place, and time.  Psychiatric:        Mood and Affect: Mood normal.        Behavior: Behavior normal.     Ortho Exam Bilateral hips good range of motion without significant pain.  Slight tenderness over the IT band of the left hip.  Bilateral knees good range of motion without pain.  Specialty Comments:  No specialty comments available.  Imaging: Xr Pelvis 1-2 Views  Result Date: 07/27/2018 AP pelvis lateral view left hip: Left hip as well located.  Hip joint appears well preserved.  No acute fractures or periosteal reactions throughout the pelvis.  Left iliac crest is overlying bowel content but no gross fracture.    PMFS History: Patient Active Problem List   Diagnosis Date Noted  . PVC (premature ventricular contraction) 01/03/2018   History reviewed. No pertinent past medical history.  History reviewed. No pertinent family history.  Past Surgical History:  Procedure Laterality Date  . PVC ABLATION N/A 01/03/2018   Procedure: PVC ABLATION;  Surgeon: Evans Lance, MD;  Location: [facility];  Service: Cardiovascular;  Laterality:  N/A;   Social History   Occupational History  . Not on file  Tobacco Use  . Smoking status: Never Smoker  . Smokeless tobacco: Never Used  Substance and Sexual Activity  . Alcohol use: Not on file  . Drug use: Not on file  . Sexual activity: Not on file

## 2018-08-15 DIAGNOSIS — Z3042 Encounter for surveillance of injectable contraceptive: Secondary | ICD-10-CM | POA: Diagnosis not present

## 2018-08-22 ENCOUNTER — Other Ambulatory Visit: Payer: Self-pay

## 2018-08-22 ENCOUNTER — Ambulatory Visit
Admission: RE | Admit: 2018-08-22 | Discharge: 2018-08-22 | Disposition: A | Discharge status: Home / Self Care | Payer: Federal, State, Local not specified - PPO | Source: Ambulatory Visit | Attending: Orthopaedic Surgery | Admitting: Orthopaedic Surgery

## 2018-08-22 DIAGNOSIS — M67854 Other specified disorders of tendon, left hip: Secondary | ICD-10-CM | POA: Diagnosis not present

## 2018-08-22 DIAGNOSIS — M25552 Pain in left hip: Secondary | ICD-10-CM

## 2018-08-29 ENCOUNTER — Ambulatory Visit (INDEPENDENT_AMBULATORY_CARE_PROVIDER_SITE_OTHER): Payer: Federal, State, Local not specified - PPO | Admitting: Orthopaedic Surgery

## 2018-08-29 ENCOUNTER — Encounter: Payer: Self-pay | Admitting: Orthopaedic Surgery

## 2018-08-29 ENCOUNTER — Other Ambulatory Visit: Payer: Self-pay

## 2018-08-29 DIAGNOSIS — M25552 Pain in left hip: Secondary | ICD-10-CM | POA: Diagnosis not present

## 2018-08-29 NOTE — Progress Notes (Signed)
Patient is a very active and healthy 48 year old female who is been having left hip pain since February when she saw [place] in June.  She was seen by the physician assistant who felt that this may be a hip issue and an MRI was warranted to assess the gluteus medius and minimus tendon area as patient where she was hurting.  She still has remained active and she points to more of the iliac crest area today as a source of her pain and not so much the hip.  She is never had groin pain but it seem like she had some pain around her left hip.  The MRI is reviewed with her and shows some gluteus medius tendinosis but otherwise no acute findings in her left hip.  On exam her hip moves smoothly on the left side.  There is no blocks rotation.  Compression of the hip causes no pain.  There is no significant pain over the trochanteric area or the IT band.  Her pain is to palpation along the abdominal obliques and the iliac crest area of the left side.  I did look at the MRI in this area for the areas that I could see in the not see any significant fluid collection.  This would take more of an abdominal MRI to see if there is a muscle tear.  Regardless I do feel that it is more important to try anti-inflammatory such as Voltaren gel in this area as well as send her to physical therapy so we can have an activity and movement assessment and any modalities that can help calm down the pain that she is having in this area.  She agrees with this treatment plan as well.  I gave her prescription for physical therapy.  We will see her back in 6 weeks to see how she is doing overall.  All question concerns were answered and addressed.

## 2018-09-07 DIAGNOSIS — M6281 Muscle weakness (generalized): Secondary | ICD-10-CM | POA: Diagnosis not present

## 2018-09-07 DIAGNOSIS — R1030 Lower abdominal pain, unspecified: Secondary | ICD-10-CM | POA: Diagnosis not present

## 2018-09-07 DIAGNOSIS — M25552 Pain in left hip: Secondary | ICD-10-CM | POA: Diagnosis not present

## 2018-09-13 DIAGNOSIS — M25552 Pain in left hip: Secondary | ICD-10-CM | POA: Diagnosis not present

## 2018-09-13 DIAGNOSIS — M6281 Muscle weakness (generalized): Secondary | ICD-10-CM | POA: Diagnosis not present

## 2018-09-13 DIAGNOSIS — R1030 Lower abdominal pain, unspecified: Secondary | ICD-10-CM | POA: Diagnosis not present

## 2018-09-15 DIAGNOSIS — M6281 Muscle weakness (generalized): Secondary | ICD-10-CM | POA: Diagnosis not present

## 2018-09-15 DIAGNOSIS — R1030 Lower abdominal pain, unspecified: Secondary | ICD-10-CM | POA: Diagnosis not present

## 2018-09-15 DIAGNOSIS — M25552 Pain in left hip: Secondary | ICD-10-CM | POA: Diagnosis not present

## 2018-09-19 DIAGNOSIS — R1030 Lower abdominal pain, unspecified: Secondary | ICD-10-CM | POA: Diagnosis not present

## 2018-09-19 DIAGNOSIS — M25552 Pain in left hip: Secondary | ICD-10-CM | POA: Diagnosis not present

## 2018-09-19 DIAGNOSIS — M6281 Muscle weakness (generalized): Secondary | ICD-10-CM | POA: Diagnosis not present

## 2018-10-03 DIAGNOSIS — F419 Anxiety disorder, unspecified: Secondary | ICD-10-CM | POA: Diagnosis not present

## 2018-10-06 DIAGNOSIS — M25552 Pain in left hip: Secondary | ICD-10-CM | POA: Diagnosis not present

## 2018-10-06 DIAGNOSIS — R1030 Lower abdominal pain, unspecified: Secondary | ICD-10-CM | POA: Diagnosis not present

## 2018-10-06 DIAGNOSIS — M6281 Muscle weakness (generalized): Secondary | ICD-10-CM | POA: Diagnosis not present

## 2018-10-10 ENCOUNTER — Encounter: Payer: Self-pay | Admitting: Orthopaedic Surgery

## 2018-10-10 ENCOUNTER — Ambulatory Visit: Payer: Federal, State, Local not specified - PPO | Admitting: Orthopaedic Surgery

## 2018-10-10 ENCOUNTER — Ambulatory Visit (INDEPENDENT_AMBULATORY_CARE_PROVIDER_SITE_OTHER): Payer: Federal, State, Local not specified - PPO | Admitting: Orthopaedic Surgery

## 2018-10-10 DIAGNOSIS — M25552 Pain in left hip: Secondary | ICD-10-CM

## 2018-10-10 NOTE — Progress Notes (Signed)
The patient is an avid runner who is 48 years old.  We have been having her go to physical therapy for her left hip.  She injured this hip sometime around February when she was jogging.  She has now been going through extensive physical therapy to work on her mechanics while she runs combined with dealing with gluteus medius tendinosis.  An MRI also showed a degenerative labral tear which was only minimal.  The therapist feels like there is a touch of iliopsoas tendinitis as well.  She did jog yesterday and she feels like she is making progress with therapy.  Her left hip moves smoothly.  There is some pain on extremes of rotation.  It is only slightly in the groin and some of the gluteus minimus tendon area.  At this point should continue increase her activities as comfort allows and will continue therapy.  I believe our next step should be considering an intra-articular steroid injection in her left hip under ultrasound by Dr. Junius Roads.  I gave her information about this and she will consider calling us back in a week or 2 to be scheduled for an appointment with Dr. Junius Roads to get an injection around her hip.  Otherwise, follow-up is as needed.  All question concerns were answered and addressed.

## 2018-11-04 DIAGNOSIS — Z3042 Encounter for surveillance of injectable contraceptive: Secondary | ICD-10-CM | POA: Diagnosis not present

## 2018-11-28 ENCOUNTER — Ambulatory Visit (INDEPENDENT_AMBULATORY_CARE_PROVIDER_SITE_OTHER): Payer: Federal, State, Local not specified - PPO | Admitting: Orthopaedic Surgery

## 2018-11-28 ENCOUNTER — Ambulatory Visit: Payer: Self-pay

## 2018-11-28 ENCOUNTER — Other Ambulatory Visit: Payer: Self-pay

## 2018-11-28 DIAGNOSIS — M25562 Pain in left knee: Secondary | ICD-10-CM

## 2018-11-28 MED ORDER — METHYLPREDNISOLONE ACETATE 40 MG/ML IJ SUSP
40.0000 mg | INTRAMUSCULAR | Status: AC | PRN
  Administered 2018-11-28: 40 mg via INTRA_ARTICULAR

## 2018-11-28 MED ORDER — LIDOCAINE HCL 1 % IJ SOLN
3.0000 mL | INTRAMUSCULAR | Status: AC | PRN
  Administered 2018-11-28: 3 mL

## 2018-11-28 NOTE — Progress Notes (Signed)
Office Visit Note   Patient: Rebecca Ellison           Date of Birth: [birth year]           MRN: [id number] Visit Date: 11/28/2018              Requested by: Kathyrn Lass, [place],  [place] [address] PCP: Kathyrn Lass, MD   Assessment & Plan: Visit Diagnoses:  1. Acute pain of left knee     Plan: I do think it is worthwhile trying at least a one-time steroid injection in her left knee.  She will work on quad strengthening exercises and avoid squats and lunges.  I explained the rationale behind a steroid injection as well as the risk and benefits and she tolerated it well.  I would like to see her back in 3 weeks to see how she is doing from a clinical exam point especially from the [facility] exam standpoint.  If she still is quite symptomatic, an MRI would be warranted of the left knee.  All question concerns were answered and addressed.  Follow-Up Instructions: Return in about 3 weeks (around 12/19/2018).   Orders:  Orders Placed This Encounter  Procedures  . Large Joint Inj  . XR Knee 1-2 Views Left   No orders of the defined types were placed in this encounter.     Procedures: Large Joint Inj: L knee on 11/28/2018 10:40 AM Indications: diagnostic evaluation and pain Details: 22 G 1.5 in needle, superolateral approach  Arthrogram: No  Medications: 3 mL lidocaine 1 %; 40 mg methylPREDNISolone acetate 40 MG/ML Outcome: tolerated well, no immediate complications Procedure, treatment alternatives, risks and benefits explained, specific risks discussed. Consent was given by the patient. Immediately prior to procedure a time out was called to verify the correct patient, procedure, equipment, support staff and site/side marked as required. Patient was prepped and draped in the usual sterile fashion.       Clinical Data: No additional findings.   Subjective: Chief Complaint  Patient presents with  . Left Knee - Pain  The patient comes in today with  acute onset of left knee pain.  She is 48 years old and works long for some.  She had previous surgery on her right shoulder and right lower extremity and let those issues go for long period time before she sought treatment.  However her left knee really flared up about a week ago and it swelled up quite a bit.  She points the medial joint line is source of her pain.  It is keeping her from doing her activities that she likely from exercise standpoint.  She is never injured this knee before.  She is now diabetic and again is very active.  HPI  Review of Systems She currently denies any headache, chest pain, shortness of breath, fever, chills, nausea, vomiting  Objective: Vital Signs: There were no vitals taken for this visit.  Physical Exam She is alert and oriented x3 and in no acute distress Ortho Exam Examination of her left knee shows no effusion today.  She is very tender along the medial joint line and has a significantly positive McMurray's exam to the medial joint line.  Her range of motion is full but when she flexion past 90 degrees with that left knee she has a lot of pain on the medial and posterior medial aspect of the knee.  The left knee feels ligamentously stable. Specialty Comments:  No specialty comments  available.  Imaging: Xr Knee 1-2 Views Left  Result Date: 11/28/2018 An AP and lateral left knee show well-maintained joint space with no acute findings.  There is no effusion.  There is no malalignment.    PMFS History: Patient Active Problem List   Diagnosis Date Noted  . 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  No family history on file.  Past Surgical History:  Procedure Laterality Date  . PVC ABLATION N/A 01/03/2018   Procedure: PVC ABLATION;  Surgeon: Marinus Maw, MD;  Location: [facility];  Service: Cardiovascular;  Laterality: N/A;   Social History   Occupational History  . Not on file   Tobacco Use  . Smoking status: Never Smoker  . Smokeless tobacco: Never Used  Substance and Sexual Activity  . Alcohol use: Not on file  . Drug use: Not on file  . Sexual activity: Not on file

## 2018-11-29 DIAGNOSIS — Z01419 Encounter for gynecological examination (general) (routine) without abnormal findings: Secondary | ICD-10-CM | POA: Diagnosis not present

## 2018-12-19 ENCOUNTER — Other Ambulatory Visit: Payer: Self-pay

## 2018-12-19 ENCOUNTER — Ambulatory Visit (INDEPENDENT_AMBULATORY_CARE_PROVIDER_SITE_OTHER): Payer: Federal, State, Local not specified - PPO | Admitting: Orthopaedic Surgery

## 2018-12-19 ENCOUNTER — Encounter: Payer: Self-pay | Admitting: Orthopaedic Surgery

## 2018-12-19 DIAGNOSIS — M25562 Pain in left knee: Secondary | ICD-10-CM | POA: Diagnosis not present

## 2018-12-19 NOTE — Progress Notes (Signed)
The patient is a very active 48 year old who comes in for continued evaluation treatment of left knee pain.  In spite of a positive Murray sign to the medial compartment of left knee I did try steroid injection see how this would help with her symptoms.  She said that the steroid injection is kept her knee from swelling.  She still has a lot of pain with pivoting activities and locking catching of the left knee.  Her range of motion has been good and she is a very athletic individual.  She does workout regularly as well as participating in [place] so she did not need a formal physical therapy program.  She is work on her own on quad strengthening exercises and avoiding other activities that were irritating the left knee.  On examination of her left knee today she still has exquisite medial joint line tenderness.  There is definitely a positive McMurray's exam to the medial compartment of her left knee.  The range of motion is full and there is no effusion.  Given the failure of conservative treatment of her left knee and the duration of treatment for being over 6 months, a MRI of her left knee is warranted to rule out a medial meniscal tear.  All question concerns were answered and addressed.  She will still avoid high impact aerobic activity as well as squats and lunges while we await the MRI of her left knee.  She knows to call and follow-up with [place] as soon as she knows the MRI date for her left knee.

## 2018-12-20 ENCOUNTER — Other Ambulatory Visit: Payer: Self-pay

## 2018-12-20 DIAGNOSIS — G8929 Other chronic pain: Secondary | ICD-10-CM

## 2018-12-20 DIAGNOSIS — M25562 Pain in left knee: Secondary | ICD-10-CM

## 2019-01-18 ENCOUNTER — Ambulatory Visit
Admission: RE | Admit: 2019-01-18 | Discharge: 2019-01-18 | Disposition: A | Discharge status: Home / Self Care | Payer: Federal, State, Local not specified - PPO | Source: Ambulatory Visit | Attending: Orthopaedic Surgery | Admitting: Orthopaedic Surgery

## 2019-01-18 DIAGNOSIS — M25562 Pain in left knee: Secondary | ICD-10-CM

## 2019-01-18 DIAGNOSIS — G8929 Other chronic pain: Secondary | ICD-10-CM

## 2019-01-23 DIAGNOSIS — K219 Gastro-esophageal reflux disease without esophagitis: Secondary | ICD-10-CM | POA: Diagnosis not present

## 2019-01-23 DIAGNOSIS — Z3009 Encounter for other general counseling and advice on contraception: Secondary | ICD-10-CM | POA: Diagnosis not present

## 2019-01-23 DIAGNOSIS — Z9889 Other specified postprocedural states: Secondary | ICD-10-CM | POA: Diagnosis not present

## 2019-01-23 DIAGNOSIS — F419 Anxiety disorder, unspecified: Secondary | ICD-10-CM | POA: Diagnosis not present

## 2019-01-24 ENCOUNTER — Encounter: Payer: Self-pay | Admitting: Orthopaedic Surgery

## 2019-01-24 ENCOUNTER — Ambulatory Visit: Payer: Federal, State, Local not specified - PPO | Admitting: Orthopaedic Surgery

## 2019-01-24 ENCOUNTER — Other Ambulatory Visit: Payer: Self-pay

## 2019-01-24 DIAGNOSIS — S83212D Bucket-handle tear of medial meniscus, current injury, left knee, subsequent encounter: Secondary | ICD-10-CM

## 2019-01-24 DIAGNOSIS — S83212A Bucket-handle tear of medial meniscus, current injury, left knee, initial encounter: Secondary | ICD-10-CM | POA: Insufficient documentation

## 2019-01-24 NOTE — Progress Notes (Signed)
The patient comes in today to go over an MRI of her left knee.  She has been having a lot of medial joint tenderness at the medial joint line left knee with locking catching.  She has tried failed other forms conservative treatment.  On examination of her left knee she does have a positive McMurray sign to the medial compartment of the knee.  There is medial joint line tenderness.  There is pain in the medial compartment and in the posterior aspect of her knee past 90 degrees of flexion.  The MRI of her left knee does show a complex medial meniscal tear involving the posterior horn to the mid body with a horizontal component.  The cartilage looks pretty good in her knee.  There is only mild thinning medially.  There is no other internal derangement.  The collateral ligaments and cruciate ligaments are intact.  The lateral compartment is normal.  This point I do recommend an arthroscopic intervention of her left knee given the continued locking catching and symptoms she is having combined with the MRI report.  I explained in detail using a knee model what the surgery involves.  We talked about the risk and benefits of surgery with the major risk being accelerated arthritis in that knee and someone who does perform high impact aerobic activities.  All question concerns were answered addressed.  I did talk about what to expect intraoperatively and postoperatively.  We would then see her back in 2 weeks postoperative when she decides to have this done.

## 2019-01-27 DIAGNOSIS — Z3042 Encounter for surveillance of injectable contraceptive: Secondary | ICD-10-CM | POA: Diagnosis not present

## 2019-01-31 NOTE — Progress Notes (Addendum)
Cardiology Office Note Date:  02/02/2019  Patient ID:  Rebecca Ellison, DOB 1970/12/15, MRN [id number] PCP:  Juluis Rainier, MD  Electrophysiologist:  Dr. Ladona Ridgel Cardiologist previously Dr. Donnie Aho     Chief Complaint: CP dizzy spells  History of Present Illness: Rebecca Ellison is a 48 y.o. female with history of PVC's, more recently anxiety, and a remote hx of recurrent syncope in her 20's that she reports extensive cardiac evaluation, including stress testing and "several tests" she seems to recall the cardiologist mentioning a betablocker though she did not want to take a medicine and felt to be vagal.  PVCs, initially evaluated by Dr. Donnie Aho, he mentions asymptomatic though frequent, the patient wanting to avoid medicines, referred to EP.  She underwent EPS, ablation procedure with Dr. Ladona Ridgel nov 2019, RVOT PVCs  She comes in today to be seen for Dr. Ladona Ridgel, last seen by him Dec 2019, she was doing well post ablation and planned for watchful waiting.  There is a phone note in Feb 2020 with pt c/o dizzy spell and CP, was recommended to get stress test done though postponed 2/2 COVID restrictions.  She mentions in the last 2 weeks noting a L sided chest discomfort and random dizzy spells, and a general sense of feeling "off".  She explains that after her ablation she had gotten back to exercise, was going the gym feeling very well.  While working out she hurt her knee that put a damper on her exercise regime and was on/off at the gym 2/2 knee pain.  This in feb 2020 when she noted that she had a couple episodes of CP and dizzy spell these were not associated wth each other.  She has trouble recalling specifics about these symptoms, but eventually got back to exercise and was again feeling well.  She has had further worsening of her knee pain and in the last 2 weeks has had to stop exercising all together.  Since then she seems to have lost a sense of wellbeing.  Mentioning that every time  she takes 10 steps forward something happens and takes her 15 steps back.  She has hd discussions with her PMD and has been started on Lexapro, unclear if this has helped yet. She is frustrated with her lack of ability to lose weight and mentions this a number of times, and frustrated about medical/musculoskeletal aches, pains that make her feel like she needs to start to acceot that she is getting older.   She links her symptoms not to times of peak exercise, but during routine things like after she has walked up the stairs feeling briefly lightheaded, sees floaters, and then it is gone.  She will just stop for a moment and collect herself and it resolves.  She has noted a relationship with this to then feeling "off" for a couple days, then fine again.  She reports drinking a lot of water, and keeping very well hydrated.  Her CP is not associated with the above, more so noted at night always L chest, not tender, not positional, not radiating, and no associated symptoms.  She has a hard time describing it.  No SOB, no syncope  Past Medical History:  Diagnosis Date  . Anxiety   . PVC (premature ventricular contraction)    RVOT, ablated 2019    Past Surgical History:  Procedure Laterality Date  . PVC ABLATION N/A 01/03/2018   Procedure: PVC ABLATION;  Surgeon: Marinus Maw, MD;  Location: [facility];  Service: Cardiovascular;  Laterality: N/A;    Current Outpatient Medications  Medication Sig Dispense Refill  . escitalopram (LEXAPRO) 20 MG tablet Take 20 mg by mouth daily.     . famotidine (PEPCID) 10 MG tablet Take 10 mg by mouth daily. For two weeks    . hydrocortisone 2.5 % cream Apply 1 application topically daily as needed (eczema).    . medroxyPROGESTERone (DEPO-PROVERA) 150 MG/ML injection Inject 150 mg into the muscle every 3 (three) months.  4  . metoprolol tartrate (LOPRESSOR) 100 MG tablet Take 1 tablet (100 mg total) by mouth once for 1 dose. Two hour prior to  procedure 1 tablet 0   No current facility-administered medications for this visit.    Allergies:   Sulfa antibiotics   Social History:  The patient  reports that she has never smoked. She has never used smokeless tobacco.   Family History:  The patient's family history includes Healthy in her sister. She was adopted.  ROS:  Please see the history of present illness.  All other systems are reviewed and otherwise negative.   PHYSICAL EXAM:  VS:  BP 112/72   Pulse 89   Ht 5\' 7"  (1.702 m)   Wt 158 lb (71.7 kg)   BMI 24.75 kg/m  BMI: Body mass index is 24.75 kg/m. 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 York Spanielilley's' note dated 11/04/2017 discussed: "Patient returns for cardiac followup. She wore  a Holter monitor that showed isolated PVCs with occasional couplets one slow triplet. 30% of her beats were PVCs. She has a previous normal treadmill test and a previous echocardiogram done in [place] that is not show any evidence of structural heart disease" (the patient dates thes testing in 2019 prior to her return home from [place])    Recent Labs: No results found for requested labs within last 8760 hours.  No results found for requested labs within last 8760 hours.   CrCl cannot be calculated (Patient's most recent lab result is  older than the maximum 21 days allowed.).   Wt Readings from Last 3 Encounters:  02/02/19 158 lb (71.7 kg)  02/01/18 150 lb (68 kg)  01/03/18 150 lb (68 kg)     Other studies reviewed: Additional studies/records reviewed today include: summarized above  ASSESSMENT AND PLAN:  1. PVCs     S/p EPS/ablation, RVOT PVCs Sept 2019       2. dizzy spells 3. CP  Symptoms fairly atypical of coronary.  She is a non-smoker, has a faternal twin who is healthy, but unknown parent/family history otherwise being adopted. Her symptoms are anxiety provoking for her She apparently had a negative stress test and normal echo last year  Orthostatic VS today not particularly/overly abnormal She has minimal drop in BP, with some rise in HR  We will have her wear a Zio AT with her dizzy  spells and send her for coronary CT given unknown family history She is pending a knee surgery next month, I have suggested that we try to get her testing done beforehand and if unable and her knee surgery being elective to wait until results and follow up.       Disposition: F/u after above testing, 4-6 weeks  Current medicines are reviewed at length with the patient today.  The patient did not have any concerns regarding medicines.  Venetia Night, PA-C 02/02/2019 9:33 AM     [address] [place] [address] [phone] (office)  [phone] (fax)

## 2019-02-02 ENCOUNTER — Encounter: Payer: Self-pay | Admitting: Physician Assistant

## 2019-02-02 ENCOUNTER — Other Ambulatory Visit: Payer: Self-pay

## 2019-02-02 ENCOUNTER — Ambulatory Visit: Payer: Federal, State, Local not specified - PPO | Admitting: Physician Assistant

## 2019-02-02 ENCOUNTER — Telehealth: Payer: Self-pay | Admitting: Radiology

## 2019-02-02 VITALS — BP 112/72 | HR 89 | Ht 67.0 in | Wt 158.0 lb

## 2019-02-02 DIAGNOSIS — R079 Chest pain, unspecified: Secondary | ICD-10-CM

## 2019-02-02 DIAGNOSIS — I493 Ventricular premature depolarization: Secondary | ICD-10-CM

## 2019-02-02 DIAGNOSIS — R42 Dizziness and giddiness: Secondary | ICD-10-CM

## 2019-02-02 LAB — BASIC METABOLIC PANEL
BUN/Creatinine Ratio: 14 (ref 9–23)
BUN: 12 mg/dL (ref 6–24)
CO2: 20 mmol/L (ref 20–29)
Calcium: 9.4 mg/dL (ref 8.7–10.2)
Chloride: 104 mmol/L (ref 96–106)
Creatinine, Ser: 0.87 mg/dL (ref 0.57–1.00)
GFR calc Af Amer: 92 mL/min/{1.73_m2} (ref >59)
GFR calc non Af Amer: 80 mL/min/{1.73_m2} (ref >59)
Glucose: 97 mg/dL (ref 65–99)
Potassium: 4.8 mmol/L (ref 3.5–5.2)
Sodium: 140 mmol/L (ref 134–144)

## 2019-02-02 MED ORDER — METOPROLOL TARTRATE 100 MG PO TABS: 100.0000 mg | ORAL_TABLET | Freq: Once | ORAL | 0 refills | Status: DC

## 2019-02-02 NOTE — Patient Instructions (Addendum)
Medication Instructions:   Your physician recommends that you continue on your current medications as directed. Please refer to the Current Medication list given to you today.  *If you need a refill on your cardiac medications before your next appointment, please call your pharmacy*  Lab Work:  BMET TODAY   If you have labs (blood work) drawn today and your tests are completely normal, you will receive your results only by: Marland Kitchen MyChart Message (if you have MyChart) OR . A paper copy in the mail If you have any lab test that is abnormal or we need to change your treatment, we will call you to review the results.  Testing/Procedures: Non-Cardiac CT scanning, (CAT scanning), is a noninvasive, special x-ray that produces cross-sectional images of the body using x-rays and a computer. CT scans help physicians diagnose and treat medical conditions. For some CT exams, a contrast material is used to enhance visibility in the area of the body being studied. CT scans provide greater clarity and reveal more details than regular x-ray exams.  Your physician has recommended that you wear an event monitor. Event monitors are medical devices that record the heart's electrical activity. Doctors most often [place] these monitors to diagnose arrhythmias. Arrhythmias are problems with the speed or rhythm of the heartbeat. The monitor is a small, portable device. You can wear one while you do your normal daily activities. This is usually used to diagnose what is causing palpitations/syncope (passing out).   Follow-Up: At [facility], you and your health needs are our priority.  As part of our continuing mission to provide you with exceptional heart care, we have created designated Provider Care Teams.  These Care Teams include your primary Cardiologist (physician) and Advanced Practice Providers (APPs -  Physician Assistants and Nurse Practitioners) who all work together to provide you with the care you need, when you  need it.  Your next appointment:   4- 6  week(s)  The format for your next appointment:   In Person  Provider:   You may see Dr.Taylor  or one of the following Advanced Practice Providers on your designated Care Team:    Gypsy Balsam, NP  Francis Dowse, PA-C  Casimiro Needle "Mardelle Matte" [place], [place]   Other Instructions   Your cardiac CT will be scheduled at one of the below locations:   [facility] [address] [place], [place] [address] [phone]  OR  [facility] [address] [place], [place] [address] [phone]  If scheduled at [facility], please arrive at the [facility] main entrance of [facility] 30-45 minutes prior to test start time. Proceed to the [facility] Radiology Department (first floor) to check-in and test prep.  If scheduled at [facility], please arrive 15 mins early for check-in and test prep.  Please follow these instructions carefully (unless otherwise directed):  Hold all erectile dysfunction medications at least 3 days (72 hrs) prior to test.  On the Night Before the Test: . Be sure to Drink plenty of water. . Do not consume any caffeinated/decaffeinated beverages or chocolate 12 hours prior to your test. . Do not take any antihistamines 12 hours prior to your test.   On the Day of the Test: . Drink plenty of water. Do not drink any water within one hour of the test. . Do not eat any food 4 hours prior to the test. . You may take your regular medications prior  to the test.  . Take metoprolol (Lopressor) two hours prior to test. . FEMALES- please wear underwire-free bra if available   *For Clinical Staff only. Please instruct patient the following:*        -Drink plenty of water       -Take metoprolol (Lopressor) 2 hours prior to test (if applicable).        -IF HR is greater than 55 BPM and patient is less than or equal to 58 yrs old  Lopressor 100mg  x1.    Do not give Lopressor to patients with an allergy to lopressor or anyone with asthma or active COPD symptoms (currently taking steroids).       After the Test: . Drink plenty of water. . After receiving IV contrast, you may experience a mild flushed feeling. This is normal. . On occasion, you may experience a mild rash up to 24 hours after the test. This is not dangerous. If this occurs, you can take Benadryl 25 mg and increase your fluid intake. . If you experience trouble breathing, this can be serious. If it is severe call 911 IMMEDIATELY. If it is mild, please call our office. . If you take any of these medications: Glipizide/Metformin, Avandament, Glucavance, please do not take 48 hours after completing test unless otherwise instructed.   Once we have confirmed authorization from your insurance company, we will call you to set up a date and time for your test.   For non-scheduling related questions, please contact the cardiac imaging nurse navigator should you have any questions/concerns: Marchia Bond, RN Navigator Cardiac Imaging [facility] [phone] Office    [place] Monitor Instructions   Your physician has requested you wear your ZIO patch monitor_14______days.   This is a single patch monitor.  [employer] supplies one patch monitor per enrollment.  Additional stickers are not available.   Please do not apply patch if you will be having a Nuclear Stress Test, Echocardiogram, Cardiac CT, MRI, or Chest Xray during the time frame you would be wearing the monitor. The patch cannot be worn during these tests.  You cannot remove and re-apply the ZIO XT patch monitor.   Your ZIO patch monitor will be sent USPS Priority mail from [facility] directly to your home address. The monitor may also be mailed to a PO BOX if home delivery is not available.   It may take 3-5 days to receive your monitor after you have been  enrolled.   Once you have received you monitor, please review enclosed instructions.  Your monitor has already been registered assigning a specific monitor serial # to you.   Applying the monitor   Shave hair from upper left chest.   Hold abrader disc by orange tab.  Rub abrader in 40 strokes over left upper chest as indicated in your monitor instructions.   Clean area with 4 enclosed alcohol pads .  Use all pads to assure are is cleaned thoroughly.  Let dry.   Apply patch as indicated in monitor instructions.  Patch will be place under collarbone on left side of chest with arrow pointing upward.   Rub patch adhesive wings for 2 minutes.Remove white label marked "1".  Remove white label marked "2".  Rub patch adhesive wings for 2 additional minutes.   While looking in a mirror, press and release button in center of patch.  A small green light will flash 3-4 times .  This will be your only  indicator the monitor has been turned on.     Do not shower for the first 24 hours.  You may shower after the first 24 hours.   Press button if you feel a symptom. You will hear a small click.  Record Date, Time and Symptom in the Patient Log Book.   When you are ready to remove patch, follow instructions on last 2 pages of Patient Log Book.  Stick patch monitor onto last page of Patient Log Book.   Place Patient Log Book in [place] box.  Use locking tab on box and tape box closed securely.  The Orange and [employer] box has [employer] postage on it.  Please place in mailbox as soon as possible.  Your physician should have your test results approximately 7 days after the monitor has been mailed back to [facility].   Call [facility] Customer Care at [phone] if you have questions regarding your ZIO XT patch monitor.  Call them immediately if you see an orange light blinking on your monitor.   If your monitor falls off in less than 4 days contact our Monitor department at [phone].  If your monitor  becomes loose or falls off after 4 days call [employer] at [phone] for suggestions on securing your monitor.

## 2019-02-02 NOTE — Telephone Encounter (Signed)
Enrolled patient for a 14 day [employer] Event monitor to be mailed to patient. Brief instructions were gone over with patient and she knows to expect the monitor to arrive in 5-7 days.  Per dictations the PA would like a Zio AT. Patients insurance doesn't always cover a Zio AT. Okayed by PA to switch to a Event monitor.

## 2019-02-07 ENCOUNTER — Telehealth: Payer: Self-pay | Admitting: Radiology

## 2019-02-07 NOTE — Telephone Encounter (Signed)
Exercise treadmill test was ordered back in Feb 2020. Please advise if patient still needs test scheduled.  If no longer needed please cancel the order

## 2019-02-08 NOTE — Telephone Encounter (Signed)
Stress test was ordered but not completed d/t covid.  Pt recently seen by APP and coronary CT ordered.  This test is no longer necessary and will cancel at this time.

## 2019-02-08 NOTE — Addendum Note (Signed)
Addended by: Willeen Cass A on: 02/08/2019 07:13 AM   Modules accepted: Orders

## 2019-02-13 ENCOUNTER — Encounter (INDEPENDENT_AMBULATORY_CARE_PROVIDER_SITE_OTHER): Payer: Federal, State, Local not specified - PPO

## 2019-02-13 DIAGNOSIS — R079 Chest pain, unspecified: Secondary | ICD-10-CM

## 2019-02-13 DIAGNOSIS — R42 Dizziness and giddiness: Secondary | ICD-10-CM | POA: Diagnosis not present

## 2019-02-16 NOTE — Addendum Note (Signed)
Addended by: Claude Manges on: 02/16/2019 02:19 PM   Modules accepted: Orders

## 2019-02-17 HISTORY — PX: HAND SURGERY: SHX662

## 2019-02-22 ENCOUNTER — Telehealth: Payer: Self-pay | Admitting: Nurse Practitioner

## 2019-02-22 NOTE — Telephone Encounter (Signed)
New Message:      Pt wants to know if she need the appt on 03-09-19 with Gypsy Balsam. She have a CT scheduled for 03-16-19- and wonder if she need the appt after that?

## 2019-02-22 NOTE — Telephone Encounter (Signed)
I called the patient because she called to advise [place] that her Cardiac CT is scheduled for 1/28, but is scheduled with Amber on 1/21.  Renee specified that the patient should be seen after CT.  I will forward to have her appt rescheduled.

## 2019-02-24 ENCOUNTER — Telehealth: Payer: Self-pay

## 2019-02-24 NOTE — Telephone Encounter (Signed)
Has questions about procedure being done on her knee.  Planning surgery on 03/23/19.  Please call her.

## 2019-03-02 ENCOUNTER — Inpatient Hospital Stay: Payer: Federal, State, Local not specified - PPO | Admitting: Orthopaedic Surgery

## 2019-03-03 ENCOUNTER — Other Ambulatory Visit: Payer: Self-pay | Admitting: Physician Assistant

## 2019-03-03 DIAGNOSIS — R079 Chest pain, unspecified: Secondary | ICD-10-CM

## 2019-03-03 DIAGNOSIS — R42 Dizziness and giddiness: Secondary | ICD-10-CM

## 2019-03-09 ENCOUNTER — Ambulatory Visit: Payer: Federal, State, Local not specified - PPO | Admitting: Nurse Practitioner

## 2019-03-15 ENCOUNTER — Telehealth (HOSPITAL_COMMUNITY): Payer: Self-pay | Admitting: Emergency Medicine

## 2019-03-15 ENCOUNTER — Encounter (HOSPITAL_COMMUNITY): Payer: Self-pay

## 2019-03-15 NOTE — Telephone Encounter (Signed)
Pt returning phone call regarding upcoming cardiac imaging study; pt verbalizes understanding of appt date/time, parking situation and where to check in, pre-test NPO status and medications ordered, and verified current allergies; name and call back number provided for further questions should they arise Jader Desai RN Navigator Cardiac Imaging [facility] [phone] office [phone] cell   

## 2019-03-15 NOTE — Telephone Encounter (Signed)
Left message on voicemail with name and callback number Emersyn Kotarski RN Navigator Cardiac Imaging [facility] [phone] Office [phone] Cell  

## 2019-03-16 ENCOUNTER — Ambulatory Visit (HOSPITAL_COMMUNITY)
Admission: RE | Admit: 2019-03-16 | Discharge: 2019-03-16 | Disposition: A | Discharge status: Home / Self Care | Payer: Federal, State, Local not specified - PPO | Source: Ambulatory Visit | Attending: Physician Assistant | Admitting: Physician Assistant

## 2019-03-16 ENCOUNTER — Other Ambulatory Visit: Payer: Self-pay

## 2019-03-16 ENCOUNTER — Encounter: Payer: Self-pay | Admitting: Physician Assistant

## 2019-03-16 DIAGNOSIS — R079 Chest pain, unspecified: Secondary | ICD-10-CM | POA: Diagnosis not present

## 2019-03-16 DIAGNOSIS — Z006 Encounter for examination for normal comparison and control in clinical research program: Secondary | ICD-10-CM

## 2019-03-16 MED ORDER — IOHEXOL 350 MG/ML SOLN
100.0000 mL | Freq: Once | INTRAVENOUS | Status: AC | PRN
  Administered 2019-03-16: 100 mL via INTRAVENOUS

## 2019-03-16 MED ORDER — METOPROLOL TARTRATE 5 MG/5ML IV SOLN: 5.0000 mg | INTRAVENOUS | Status: DC | PRN

## 2019-03-16 MED ORDER — NITROGLYCERIN 0.4 MG SL SUBL: 0.8000 mg | SUBLINGUAL_TABLET | Freq: Once | SUBLINGUAL | Status: DC

## 2019-03-16 MED ORDER — NITROGLYCERIN 0.4 MG SL SUBL
SUBLINGUAL_TABLET | SUBLINGUAL | Status: AC
  Administered 2019-03-16: 0.4 mg
  Filled 2019-03-16: qty 2

## 2019-03-16 MED ORDER — METOPROLOL TARTRATE 5 MG/5ML IV SOLN
INTRAVENOUS | Status: AC
  Filled 2019-03-16: qty 5

## 2019-03-16 NOTE — Telephone Encounter (Signed)
error 

## 2019-03-16 NOTE — Research (Signed)
Cadfem Informed Consent    Patient Name:  [name]   Subject met inclusion and exclusion criteria.  The informed consent form, study requirements and expectations were reviewed with the subject and questions and concerns were addressed prior to the signing of the consent form.  The subject verbalized understanding of the trail requirements.  The subject agreed to participate in the CADFEM trial and signed the informed consent.  The informed consent was obtained prior to performance of any protocol-specific procedures for the subject.  A copy of the signed informed consent was given to the subject and a copy was placed in the subject's medical record.   Rebecca Ellison

## 2019-03-17 ENCOUNTER — Ambulatory Visit: Payer: Federal, State, Local not specified - PPO | Admitting: Physician Assistant

## 2019-03-17 ENCOUNTER — Encounter: Payer: Self-pay | Admitting: Physician Assistant

## 2019-03-17 ENCOUNTER — Other Ambulatory Visit: Payer: Self-pay | Admitting: Obstetrics & Gynecology

## 2019-03-17 VITALS — BP 110/82 | HR 91 | Ht 67.0 in | Wt 163.0 lb

## 2019-03-17 DIAGNOSIS — I493 Ventricular premature depolarization: Secondary | ICD-10-CM | POA: Diagnosis not present

## 2019-03-17 DIAGNOSIS — R918 Other nonspecific abnormal finding of lung field: Secondary | ICD-10-CM | POA: Diagnosis not present

## 2019-03-17 DIAGNOSIS — D2271 Melanocytic nevi of right lower limb, including hip: Secondary | ICD-10-CM | POA: Diagnosis not present

## 2019-03-17 DIAGNOSIS — R0789 Other chest pain: Secondary | ICD-10-CM

## 2019-03-17 DIAGNOSIS — Z85828 Personal history of other malignant neoplasm of skin: Secondary | ICD-10-CM | POA: Diagnosis not present

## 2019-03-17 DIAGNOSIS — Z1231 Encounter for screening mammogram for malignant neoplasm of breast: Secondary | ICD-10-CM

## 2019-03-17 DIAGNOSIS — D225 Melanocytic nevi of trunk: Secondary | ICD-10-CM | POA: Diagnosis not present

## 2019-03-17 DIAGNOSIS — R42 Dizziness and giddiness: Secondary | ICD-10-CM

## 2019-03-17 DIAGNOSIS — L821 Other seborrheic keratosis: Secondary | ICD-10-CM | POA: Diagnosis not present

## 2019-03-17 NOTE — Progress Notes (Signed)
Cardiology Office Note Date:  03/17/2019  Patient ID:  Rebecca Ellison, DOB [birth year], MRN [id number] PCP:  Juluis Rainier, MD  Electrophysiologist:  Dr. Ladona Ridgel Cardiologist previously Dr. Donnie Aho     Chief Complaint:   follow up on testing, symptoms  History of Present Illness: Rebecca Ellison is a 49 y.o. female with history of PVC's, more recently anxiety, and a remote hx of recurrent syncope in her 20's that she reports extensive cardiac evaluation, including stress testing and "several tests" she seems to recall the cardiologist mentioning a betablocker though she did not want to take a medicine and felt to be vagal.  PVCs, initially evaluated by Dr. Donnie Aho, he mentions asymptomatic though frequent, the patient wanting to avoid medicines, referred to EP.  She underwent EPS, ablation procedure with Dr. Ladona Ridgel nov 2019, RVOT PVCs  She comes in today to be seen for Dr. Ladona Ridgel, last seen by him Dec 2019, she was doing well post ablation and planned for watchful waiting.  There is a phone note in Feb 2020 with pt c/o dizzy spell and CP, was recommended to get stress test done though postponed 2/2 COVID restrictions.  I saw her 02/02/2019, she reported in the prior 2 weeks noting a L sided chest discomfort and random dizzy spells, and a general sense of feeling "off".  She explained that after her ablation she had gotten back to exercise, was going the gym feeling very well.  While working out she hurt her knee that put a damper on her exercise regime and was on/off at the gym 2/2 knee pain.  This in feb 2020 when she noted that she had a couple episodes of CP and dizzy spell these were not associated wth each other.  She had trouble recalling specifics about these symptoms, but eventually got back to exercise and was again feeling well.  She had further worsening of her knee pain and in the prior 2 weeks and had to stop exercising all together.  Since then she seems to have lost a sense of  wellbeing.  Mentioning that every time she took 10 steps forward something happens and takes her 15 steps back.  She had discussions with her PMD and has been started on Lexapro, unclear if this has helped yet. She was frustrated with her lack of ability to lose weight and mentioned this a number of times, and frustrated about medical/musculoskeletal aches, pains that make her feel like she needs to start to accept that she is getting older.   She linked her symptoms not to times of peak exercise, but during routine things like after she has walked up the stairs feeling briefly lightheaded, sees floaters, and then it is gone.  She would  stop for a moment and collect herself and it resolves.  She noted a relationship with this to then feeling "off" for a couple days, then fine again.  She reported drinking a lot of water, and keeping very well hydrated.  Her CP was not associated with the above, more so noted at night always L chest, not tender, not positional, not radiating, and no associated symptoms.  She has a hard time describing it. No SOB, no syncope  She was planned for coronary CT and monitoring Coronary CT noted no CAD, Ca++ score of zero Monitor noted SR/ST,  Symptom tracing of lightheaded dizzy, was SR with one PVC   Today she mentions that on Sunday she got up was feeling ok,. When opening her blinds she turned  her head to look at something and became very dizzy and unsteady, not like fainting, but a sensation of movement.  The rest of the day any time she made significant movements this feeling worsened and spent the majority of her day on the couch and by mid day Monday this was gone and felt well.  No CP. She has not fainted.  No palpitations, no SOB  While wearing the monitor she did exercise, late AM jogging and afternoons would have been working out om her punching bag.  States the times of day she was noted with high HR xcorrelated with times she would have been  exercising   Past Medical History:  Diagnosis Date  . Anxiety   . PVC (premature ventricular contraction)    RVOT, ablated 2019    Past Surgical History:  Procedure Laterality Date  . PVC ABLATION N/A 01/03/2018   Procedure: PVC ABLATION;  Surgeon: Marinus Maw, MD;  Location: [facility] INVASIVE CV LAB;  Service: Cardiovascular;  Laterality: N/A;    Current Outpatient Medications  Medication Sig Dispense Refill  . escitalopram (LEXAPRO) 20 MG tablet Take 20 mg by mouth daily.     . hydrocortisone 2.5 % cream Apply 1 application topically daily as needed (eczema).    . medroxyPROGESTERone (DEPO-PROVERA) 150 MG/ML injection Inject 150 mg into the muscle every 3 (three) months.  4   No current facility-administered medications for this visit.    Allergies:   Sulfa antibiotics   Social History:  The patient  reports that she has never smoked. She has never used smokeless tobacco.   Family History:  The patient's family history includes Healthy in her sister. She was adopted.  ROS:  Please see the history of present illness.  All other systems are reviewed and otherwise negative.   PHYSICAL EXAM:  VS:  BP 110/82   Pulse 91   Ht 5\' 7"  (1.702 m)   Wt 163 lb (73.9 kg)   BMI 25.53 kg/m  BMI: Body mass index is 25.53 kg/m. Well nourished, well developed, healthy appearing in no acute distress  HEENT: normocephalic, atraumatic  Neck: no JVD, carotid bruits or masses Cardiac:  RRR; no significant murmurs, no rubs, or gallops Lungs:  CTA b/l, no wheezing, rhonchi or rales  Abd: soft, nontender MS: no deformity or atrophy Ext:  no edema  Skin: warm and dry, no rash Neuro:  No gross deficits appreciated Psych: euthymic mood, full affect     EKG:  Not done today   03/16/2019: Coronary CT IMPRESSION: 1. No evidence of CAD, CADRADS = 0. 2. Coronary calcium score of 0. This was 0 percentile for age and sex matched control. 3. Normal coronary origin with right  dominance. IMPRESSION: Incidental finding of lower lobe pulmonary nodules. Non-contrast chest CT at 6-12 months is recommended. 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 01/05/2018' note dated 11/04/2017 discussed: "Patient returns for cardiac followup. She wore  a Holter monitor that showed isolated PVCs with  occasional couplets one slow triplet. 30% of her beats were PVCs. She has a previous normal treadmill test and a previous echocardiogram done in [place] that is not show any evidence of structural heart disease" (the patient dates thes testing in 2019 prior to her return home from [place])    Recent Labs: 02/02/2019: BUN 12; Creatinine, Ser 0.87; Potassium 4.8; Sodium 140  No results found for requested labs within last 8760 hours.   CrCl cannot be calculated (Patient's most recent lab result is older than the maximum 21 days allowed.).   Wt Readings from Last 3 Encounters:  03/17/19 163 lb (73.9 kg)  02/02/19 158 lb (71.7 kg)  02/01/18 150 lb (68 kg)     Other studies reviewed: Additional studies/records reviewed today include: summarized above  ASSESSMENT AND PLAN:  1. PVCs     S/p EPS/ablation, RVOT PVCs Sept 2019          2. dizzy spells     Sounding more of a vertigo by her description today     monitor does not note arrhythmia, bradycardia     She had ST that correlated with  times of exercise  3. CP     Non-cardiac/coronary with ca++ score of zero and no CAD by CT     discuss with PMD, possible non-coronary etiologies, ? GI , GERD?   4. Incidental finding of pulmonary nodules     Never smoked     Discussed with her    Disposition:   Recommend she see her PMD for further w/u of her dizziness that today sounds more like vertigo, and he CP.  As well as follow up on pulmonary nodules and any further evaluation or follow up on these.    Current medicines are reviewed at length with the patient today.  The patient did not have any concerns regarding medicines.  Venetia Night, PA-C 03/17/2019 3:11 PM     [place] [address] [address] [phone] (office)  [phone] (fax)

## 2019-03-17 NOTE — Patient Instructions (Signed)
Medication Instructions:  Your physician recommends that you continue on your current medications as directed. Please refer to the Current Medication list given to you today.  *If you need a refill on your cardiac medications before your next appointment, please call your pharmacy*  Lab Work: NONE ORDERED  TODAY   If you have labs (blood work) drawn today and your tests are completely normal, you will receive your results only by: Marland Kitchen MyChart Message (if you have MyChart) OR . A paper copy in the mail If you have any lab test that is abnormal or we need to change your treatment, we will call you to review the results.  Testing/Procedures: NONE ORDERED  TODAY   Follow-Up: At [facility], you and your health needs are our priority.  As part of our continuing mission to provide you with exceptional heart care, we have created designated Provider Care Teams.  These Care Teams include your primary Cardiologist (physician) and Advanced Practice Providers (APPs -  Physician Assistants and Nurse Practitioners) who all work together to provide you with the care you need, when you need it.  Your next appointment:   6 month(s)  The format for your next appointment:   In Person  Provider:   You may see one of the following Advanced Practice Providers on your designated Care Team:    Gypsy Balsam, NP  Francis Dowse, PA-C  Casimiro Needle "Otilio Saber, [place]   Other Instructions

## 2019-03-23 ENCOUNTER — Other Ambulatory Visit: Payer: Self-pay | Admitting: Orthopaedic Surgery

## 2019-03-23 ENCOUNTER — Encounter: Payer: Self-pay | Admitting: Orthopaedic Surgery

## 2019-03-23 DIAGNOSIS — Y999 Unspecified external cause status: Secondary | ICD-10-CM | POA: Diagnosis not present

## 2019-03-23 DIAGNOSIS — S83212A Bucket-handle tear of medial meniscus, current injury, left knee, initial encounter: Secondary | ICD-10-CM | POA: Diagnosis not present

## 2019-03-23 DIAGNOSIS — S83212D Bucket-handle tear of medial meniscus, current injury, left knee, subsequent encounter: Secondary | ICD-10-CM | POA: Diagnosis not present

## 2019-03-23 DIAGNOSIS — M2242 Chondromalacia patellae, left knee: Secondary | ICD-10-CM | POA: Diagnosis not present

## 2019-03-23 MED ORDER — HYDROCODONE-ACETAMINOPHEN 5-325 MG PO TABS: 1.0000 | ORAL_TABLET | Freq: Four times a day (QID) | ORAL | 0 refills | Status: DC | PRN

## 2019-03-27 DIAGNOSIS — F419 Anxiety disorder, unspecified: Secondary | ICD-10-CM | POA: Diagnosis not present

## 2019-03-27 DIAGNOSIS — R911 Solitary pulmonary nodule: Secondary | ICD-10-CM | POA: Diagnosis not present

## 2019-03-27 DIAGNOSIS — K219 Gastro-esophageal reflux disease without esophagitis: Secondary | ICD-10-CM | POA: Diagnosis not present

## 2019-03-27 DIAGNOSIS — R0789 Other chest pain: Secondary | ICD-10-CM | POA: Diagnosis not present

## 2019-03-30 ENCOUNTER — Ambulatory Visit (INDEPENDENT_AMBULATORY_CARE_PROVIDER_SITE_OTHER): Payer: Federal, State, Local not specified - PPO | Admitting: Orthopaedic Surgery

## 2019-03-30 ENCOUNTER — Other Ambulatory Visit: Payer: Self-pay

## 2019-03-30 ENCOUNTER — Encounter: Payer: Self-pay | Admitting: Orthopaedic Surgery

## 2019-03-30 DIAGNOSIS — S83212D Bucket-handle tear of medial meniscus, current injury, left knee, subsequent encounter: Secondary | ICD-10-CM

## 2019-03-30 DIAGNOSIS — Z9889 Other specified postprocedural states: Secondary | ICD-10-CM

## 2019-03-30 NOTE — Progress Notes (Signed)
The patient is doing very well after a left knee arthroscopy.  She had a meniscal tear from the posterior horn to the mid body.  We performed a partial medial meniscectomy and left knee and still left her with significant amount of remaining meniscus.  The cartilage on the medial compartment was otherwise normal and pristine.  The remainder of her knee look great other than some mild chondromalacia the patella.  She is doing well overall.  On exam she still has a mild effusion from surgery but nothing worrisome in terms of aspirating her knee.  She has good flexion and extension.  I remove the sutures from her knee.  We talked about quad strengthening exercises and how to get her knee back in shape.  I want her to avoid running for at least the next 4 to 6 weeks but she can use a treadmill or an elliptical or even an exercise recumbent bike or a sit up bike.  All question concerns were answered and addressed.  We will see her back in 4 weeks just for repeat exam.

## 2019-04-17 ENCOUNTER — Ambulatory Visit (INDEPENDENT_AMBULATORY_CARE_PROVIDER_SITE_OTHER): Payer: Federal, State, Local not specified - PPO | Admitting: Orthopaedic Surgery

## 2019-04-17 ENCOUNTER — Encounter: Payer: Self-pay | Admitting: Orthopaedic Surgery

## 2019-04-17 ENCOUNTER — Ambulatory Visit: Payer: Self-pay

## 2019-04-17 ENCOUNTER — Other Ambulatory Visit: Payer: Self-pay

## 2019-04-17 DIAGNOSIS — M545 Low back pain, unspecified: Secondary | ICD-10-CM

## 2019-04-17 MED ORDER — CYCLOBENZAPRINE HCL 10 MG PO TABS: 10.0000 mg | ORAL_TABLET | Freq: Three times a day (TID) | ORAL | 0 refills | Status: DC | PRN

## 2019-04-17 MED ORDER — METHYLPREDNISOLONE 4 MG PO TABS: ORAL_TABLET | ORAL | 0 refills | Status: DC

## 2019-04-17 NOTE — Progress Notes (Signed)
Office Visit Note   Patient: Rebecca Ellison           Date of Birth: May 13, 1970           MRN: [id number] Visit Date: 04/17/2019              Requested by: Leighton Ruff, MD [place],  [place] [address] PCP: Leighton Ruff, MD   Assessment & Plan: Visit Diagnoses:  1. Acute right-sided low back pain, unspecified whether sciatica present     Plan: Due to the acute nature of her pain and when to try combination of a steroid taper and Flexeril.  She does have pain medication at home.  I have shown her back extension exercises I want her to try.  She will keep her follow-up appointment with [place] next week.  If things have not improved we will consider the next options.  All question concerns were answered addressed.  If things get acutely worse or she has change in her bowel or bladder function she will let us know urgently.  Follow-Up Instructions: Return in about 1 week (around 04/24/2019).   Orders:  Orders Placed This Encounter  Procedures  . XR Lumbar Spine 2-3 Views   Meds ordered this encounter  Medications  . methylPREDNISolone (MEDROL) 4 MG tablet    Sig: Medrol dose pack. Take as instructed    Dispense:  21 tablet    Refill:  0  . cyclobenzaprine (FLEXERIL) 10 MG tablet    Sig: Take 1 tablet (10 mg total) by mouth 3 (three) times daily as needed for muscle spasms.    Dispense:  60 tablet    Refill:  0      Procedures: No procedures performed   Clinical Data: No additional findings.   Subjective: Chief Complaint  Patient presents with  . Left Knee - Follow-up  The patient comes today with acute low back pain.  She injured her back yesterday when she was lifting a box from a car.  She reports right-sided low back pain that does radiate down her anterior thigh to her ankle on the right side.  There is a slight burning sensation as well.  She has tried ice and heat.  She is in recovery from a knee arthroscopy for her left knee.  She says the  knee is doing well.  She is never injured her back before.  She is not a diabetic and not a smoker and is a very physically fit and active 49 years old.  HPI  Review of Systems She currently denies any headache, chest pain, shortness of breath, fever, chills, nausea, vomiting  Objective: Vital Signs: There were no vitals taken for this visit.  Physical Exam She is alert and orient x3 and in no acute distress Ortho Exam Examination of her low back shows that she is deftly having a lot of discomfort.  She can forward flex but cannot extend well.  She has negative straight leg raise bilaterally and excellent strength in her bilateral lower extremities.  She is walking somewhat leaned over obviously showing pain in her lumbar spine. Specialty Comments:  No specialty comments available.  Imaging: XR Lumbar Spine 2-3 Views  Result Date: 04/17/2019 She views of the lumbar spine show no acute findings other than loss of normal lumbar lordosis.    PMFS History: Patient Active Problem List   Diagnosis Date Noted  . Bucket-handle tear of medial meniscus of left knee as current injury 01/24/2019  .  PVC (premature ventricular contraction) 01/03/2018   Past Medical History:  Diagnosis Date  . Anxiety   . PVC (premature ventricular contraction)    RVOT, ablated 2019    Family History  Adopted: Yes  Problem Relation Age of Onset  . Healthy Sister     Past Surgical History:  Procedure Laterality Date  . PVC ABLATION N/A 01/03/2018   Procedure: PVC ABLATION;  Surgeon: Marinus Maw, MD;  Location: [facility] INVASIVE CV LAB;  Service: Cardiovascular;  Laterality: N/A;   Social History   Occupational History  . Not on file  Tobacco Use  . Smoking status: Never Smoker  . Smokeless tobacco: Never Used  Substance and Sexual Activity  . Alcohol use: Not on file  . Drug use: Not on file  . Sexual activity: Not on file

## 2019-04-18 DIAGNOSIS — Z3042 Encounter for surveillance of injectable contraceptive: Secondary | ICD-10-CM | POA: Diagnosis not present

## 2019-04-24 ENCOUNTER — Ambulatory Visit (INDEPENDENT_AMBULATORY_CARE_PROVIDER_SITE_OTHER): Payer: Federal, State, Local not specified - PPO | Admitting: Orthopaedic Surgery

## 2019-04-24 ENCOUNTER — Encounter: Payer: Self-pay | Admitting: Orthopaedic Surgery

## 2019-04-24 ENCOUNTER — Ambulatory Visit
Admission: RE | Admit: 2019-04-24 | Discharge: 2019-04-24 | Disposition: A | Discharge status: Home / Self Care | Payer: Federal, State, Local not specified - PPO | Source: Ambulatory Visit | Attending: Obstetrics & Gynecology | Admitting: Obstetrics & Gynecology

## 2019-04-24 ENCOUNTER — Other Ambulatory Visit: Payer: Self-pay

## 2019-04-24 DIAGNOSIS — M545 Low back pain, unspecified: Secondary | ICD-10-CM

## 2019-04-24 DIAGNOSIS — Z9889 Other specified postprocedural states: Secondary | ICD-10-CM

## 2019-04-24 DIAGNOSIS — Z1231 Encounter for screening mammogram for malignant neoplasm of breast: Secondary | ICD-10-CM | POA: Diagnosis not present

## 2019-04-24 NOTE — Progress Notes (Signed)
The patient is just over 4 weeks status post a left knee arthroscopy for significant medial meniscal tear.  During her rehabilitation she is moving a large box a week ago and had severe low back pain and sciatic pain.  We saw her in the office last week.  I put her on a 6-day steroid taper.  She says her back symptoms and almost completely resolved.  She is denying any radicular symptoms at all.  She is now working on back extension exercises and core strengthening.  She states her left operative knee is still having some twinges of pain but overall she is doing well.  She gets up from a chair easily and walks around the room easily.  This is much different than when I saw her last week.  She has 5 out of 5 strength in her bilateral lower extremities and normal sensation.  Her straight leg raise is now negative.  At this point she will continue to slowly increase her activities and continue to work on quad strengthening and core strengthening exercises as well as back extension exercises.  I will see her back for her regular postoperative visit for the left knee in 4 weeks from now.  If she still having any twinges of pain we may consider steroid injection but hopefully she will continue to be significantly improving.

## 2019-04-25 ENCOUNTER — Ambulatory Visit: Payer: Federal, State, Local not specified - PPO | Admitting: Orthopaedic Surgery

## 2019-04-26 ENCOUNTER — Other Ambulatory Visit: Payer: Self-pay | Admitting: Obstetrics & Gynecology

## 2019-04-26 DIAGNOSIS — R928 Other abnormal and inconclusive findings on diagnostic imaging of breast: Secondary | ICD-10-CM

## 2019-05-11 ENCOUNTER — Ambulatory Visit
Admission: RE | Admit: 2019-05-11 | Discharge: 2019-05-11 | Disposition: A | Discharge status: Home / Self Care | Payer: Federal, State, Local not specified - PPO | Source: Ambulatory Visit | Attending: Obstetrics & Gynecology | Admitting: Obstetrics & Gynecology

## 2019-05-11 ENCOUNTER — Other Ambulatory Visit: Payer: Self-pay

## 2019-05-11 ENCOUNTER — Other Ambulatory Visit: Payer: Self-pay | Admitting: Obstetrics & Gynecology

## 2019-05-11 DIAGNOSIS — R928 Other abnormal and inconclusive findings on diagnostic imaging of breast: Secondary | ICD-10-CM

## 2019-05-11 DIAGNOSIS — N6489 Other specified disorders of breast: Secondary | ICD-10-CM | POA: Diagnosis not present

## 2019-05-11 DIAGNOSIS — R922 Inconclusive mammogram: Secondary | ICD-10-CM | POA: Diagnosis not present

## 2019-05-22 ENCOUNTER — Encounter: Payer: Self-pay | Admitting: Orthopaedic Surgery

## 2019-05-22 ENCOUNTER — Ambulatory Visit (INDEPENDENT_AMBULATORY_CARE_PROVIDER_SITE_OTHER): Payer: Federal, State, Local not specified - PPO | Admitting: Orthopaedic Surgery

## 2019-05-22 ENCOUNTER — Other Ambulatory Visit: Payer: Self-pay

## 2019-05-22 DIAGNOSIS — Z9889 Other specified postprocedural states: Secondary | ICD-10-CM

## 2019-05-22 DIAGNOSIS — M545 Low back pain, unspecified: Secondary | ICD-10-CM

## 2019-05-22 NOTE — Progress Notes (Signed)
Patient is a very pleasant 49 year old female well-known to me.  She is 8 weeks out from a significant left knee arthroscopy where she had a partial medial meniscectomy.  Postoperatively she also dealt with right-sided sciatica which is since resolved.  She does feel from time to time like her knee is not as stable as she would like but overall is making progress.  She only states that her left knee gives way on her.  She is very physically fit and wanting to increase her activities.  On examination of her left knee there is no significant effusion at this point.  She has excellent flexion and extension of that knee and is ligamentously stable.  Her tenderness is minimal.  I gave her reassurance that this is what she should be feeling at 8 weeks after a significant knee arthroscopy such as this.  She should continue her activities that can work on her balance and coordination and strengthening of her knees.  We will see her back in 4 weeks to see how she is doing overall.  I also recommended a compressive knee sleeve to try.

## 2019-05-24 ENCOUNTER — Ambulatory Visit
Admission: RE | Admit: 2019-05-24 | Discharge: 2019-05-24 | Disposition: A | Discharge status: Home / Self Care | Payer: Federal, State, Local not specified - PPO | Source: Ambulatory Visit | Attending: Obstetrics & Gynecology | Admitting: Obstetrics & Gynecology

## 2019-05-24 ENCOUNTER — Other Ambulatory Visit: Payer: Self-pay

## 2019-05-24 DIAGNOSIS — R928 Other abnormal and inconclusive findings on diagnostic imaging of breast: Secondary | ICD-10-CM

## 2019-05-24 DIAGNOSIS — R921 Mammographic calcification found on diagnostic imaging of breast: Secondary | ICD-10-CM | POA: Diagnosis not present

## 2019-05-24 DIAGNOSIS — N6011 Diffuse cystic mastopathy of right breast: Secondary | ICD-10-CM | POA: Diagnosis not present

## 2019-05-25 DIAGNOSIS — K219 Gastro-esophageal reflux disease without esophagitis: Secondary | ICD-10-CM | POA: Diagnosis not present

## 2019-05-25 DIAGNOSIS — R42 Dizziness and giddiness: Secondary | ICD-10-CM | POA: Diagnosis not present

## 2019-05-25 DIAGNOSIS — F419 Anxiety disorder, unspecified: Secondary | ICD-10-CM | POA: Diagnosis not present

## 2019-05-25 DIAGNOSIS — K589 Irritable bowel syndrome without diarrhea: Secondary | ICD-10-CM | POA: Diagnosis not present

## 2019-06-08 DIAGNOSIS — R140 Abdominal distension (gaseous): Secondary | ICD-10-CM | POA: Diagnosis not present

## 2019-06-08 DIAGNOSIS — R142 Eructation: Secondary | ICD-10-CM | POA: Diagnosis not present

## 2019-06-08 DIAGNOSIS — R112 Nausea with vomiting, unspecified: Secondary | ICD-10-CM | POA: Diagnosis not present

## 2019-06-08 DIAGNOSIS — R195 Other fecal abnormalities: Secondary | ICD-10-CM | POA: Diagnosis not present

## 2019-06-19 ENCOUNTER — Ambulatory Visit (INDEPENDENT_AMBULATORY_CARE_PROVIDER_SITE_OTHER): Payer: Federal, State, Local not specified - PPO | Admitting: Orthopaedic Surgery

## 2019-06-19 ENCOUNTER — Other Ambulatory Visit: Payer: Self-pay

## 2019-06-19 ENCOUNTER — Encounter: Payer: Self-pay | Admitting: Orthopaedic Surgery

## 2019-06-19 ENCOUNTER — Ambulatory Visit: Payer: Federal, State, Local not specified - PPO | Admitting: Orthopaedic Surgery

## 2019-06-19 DIAGNOSIS — Z9889 Other specified postprocedural states: Secondary | ICD-10-CM

## 2019-06-19 NOTE — Progress Notes (Signed)
The patient is a very active 49 year old female who is now 12 weeks status post a left knee arthroscopy with a partial medial meniscectomy.  She says the knee does swell some after activities such as jogging but overall she is doing well.  Her recent bout of sciatica has resolved as well.  On examination of her left knee today there is only just a slight effusion comparing the left and right knees.  Her range of motion is full emergency exam is negative to the medial compartment.  There is some posterior medial pain to be expected given the extent of her meniscal tear and arthroscopy.  This point she will continue increase her activities as comfort allows.  There is really no restrictions for her.  I would only place a steroid injection in her knee if it does continue to give her problems.  All questions and concerns were answered and addressed.  Follow-up can be as needed.

## 2019-06-20 ENCOUNTER — Other Ambulatory Visit: Payer: Self-pay | Admitting: Gastroenterology

## 2019-06-20 DIAGNOSIS — R112 Nausea with vomiting, unspecified: Secondary | ICD-10-CM

## 2019-06-27 ENCOUNTER — Other Ambulatory Visit: Payer: Federal, State, Local not specified - PPO

## 2019-07-05 ENCOUNTER — Ambulatory Visit
Admission: RE | Admit: 2019-07-05 | Discharge: 2019-07-05 | Disposition: A | Discharge status: Home / Self Care | Payer: Federal, State, Local not specified - PPO | Source: Ambulatory Visit | Attending: Gastroenterology | Admitting: Gastroenterology

## 2019-07-05 DIAGNOSIS — R112 Nausea with vomiting, unspecified: Secondary | ICD-10-CM

## 2019-07-05 DIAGNOSIS — K7689 Other specified diseases of liver: Secondary | ICD-10-CM | POA: Diagnosis not present

## 2019-07-06 DIAGNOSIS — R0989 Other specified symptoms and signs involving the circulatory and respiratory systems: Secondary | ICD-10-CM | POA: Diagnosis not present

## 2019-07-06 DIAGNOSIS — R112 Nausea with vomiting, unspecified: Secondary | ICD-10-CM | POA: Diagnosis not present

## 2019-07-06 DIAGNOSIS — R140 Abdominal distension (gaseous): Secondary | ICD-10-CM | POA: Diagnosis not present

## 2019-07-06 DIAGNOSIS — R195 Other fecal abnormalities: Secondary | ICD-10-CM | POA: Diagnosis not present

## 2019-07-13 DIAGNOSIS — Z3042 Encounter for surveillance of injectable contraceptive: Secondary | ICD-10-CM | POA: Diagnosis not present

## 2019-08-15 DIAGNOSIS — L237 Allergic contact dermatitis due to plants, except food: Secondary | ICD-10-CM | POA: Diagnosis not present

## 2019-09-12 ENCOUNTER — Ambulatory Visit: Payer: Federal, State, Local not specified - PPO | Admitting: Orthopaedic Surgery

## 2019-09-12 ENCOUNTER — Other Ambulatory Visit: Payer: Self-pay

## 2019-09-12 ENCOUNTER — Encounter: Payer: Self-pay | Admitting: Orthopaedic Surgery

## 2019-09-12 ENCOUNTER — Ambulatory Visit: Payer: Self-pay

## 2019-09-12 DIAGNOSIS — M79645 Pain in left finger(s): Secondary | ICD-10-CM

## 2019-09-12 NOTE — Progress Notes (Signed)
Office Visit Note   Patient: Rebecca Ellison           Date of Birth: Apr 17, 1970           MRN: [id number] Visit Date: 09/12/2019              Requested by: Juluis Rainier, MD [address] [place],  [place] [address] PCP: Juluis Rainier, MD   Assessment & Plan: Visit Diagnoses:  1. Pain of left thumb     Plan: Based on her clinical exam I am worried about an ulnar collateral ligament injury to the left thumb.  This necessitates in ASAP MRI of the left thumb to evaluate the MCP joint and the ulnar collateral ligament.  Given that she is now over 3 weeks out from her injury it is essential we obtain this MRI because this would help [place] understand better what her injury is and whether or not she needs a surgical evaluation.  She agrees with this treatment plan.  She will be careful with the thumb in the interim while we await the MRI results.  All questions and concerns were answered and addressed.  Follow-Up Instructions: No follow-ups on file.   Orders:  Orders Placed This Encounter  Procedures  . XR Finger Thumb Left   No orders of the defined types were placed in this encounter.     Procedures: No procedures performed   Clinical Data: No additional findings.   Subjective: Chief Complaint  Patient presents with  . 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  She injured her left thumb recently well she was doing workout as well as part of her job performing defensive training and practicing defensive training.  At that point where she injured her left thumb.  She reported swelling and has significant pain around the MCP joint.  She has been trying rest and activity modification as well as ice and a thumb splint.  She says she is not able to use a gun in that left hand which she needs to do for her long fortunate job.  The injury occurred on July 2 so she is at least given it over 3 weeks to try to  feel better.  HPI  Review of Systems She currently denies any headache, chest pain, shortness of breath, fever, chills, nausea, vomiting  Objective: Vital Signs: There were no vitals taken for this visit.  Physical Exam She is alert and orient x3 and in no acute distress Ortho Exam Examination of her left thumb shows significant pain along the MCP joint and specifically around the ulnar collateral ligament just ulnar to the MCP joint.  She withdraws quite a bit for pain.  Is difficult to get a ligamentous exam due to the pain she is having. Specialty Comments:  No specialty comments available.  Imaging: XR Finger Thumb Left  Result Date: 09/12/2019 2 views of the left thumb show no obvious fracture or Steiner lesion over the area of the MCP joint where the patient hurts.    PMFS History: Patient Active Problem List   Diagnosis Date Noted  . Bucket-handle tear of medial meniscus of left knee as current injury 01/24/2019  . PVC (premature ventricular contraction) 01/03/2018   Past Medical History:  Diagnosis Date  . Anxiety   . PVC (premature ventricular contraction)    RVOT, ablated 2019    Family History  Adopted: Yes  Problem  Relation Age of Onset  . Healthy Sister     Past Surgical History:  Procedure Laterality Date  . PVC ABLATION N/A 01/03/2018   Procedure: PVC ABLATION;  Surgeon: Marinus Maw, MD;  Location: [facility] INVASIVE CV LAB;  Service: Cardiovascular;  Laterality: N/A;   Social History   Occupational History  . Not on file  Tobacco Use  . Smoking status: Never Smoker  . Smokeless tobacco: Never Used  Vaping Use  . Vaping Use: Never used  Substance and Sexual Activity  . Alcohol use: Not on file  . Drug use: Not on file  . Sexual activity: Not on file

## 2019-09-13 ENCOUNTER — Ambulatory Visit
Admission: RE | Admit: 2019-09-13 | Discharge: 2019-09-13 | Disposition: A | Discharge status: Home / Self Care | Payer: Federal, State, Local not specified - PPO | Source: Ambulatory Visit | Attending: Orthopaedic Surgery | Admitting: Orthopaedic Surgery

## 2019-09-13 ENCOUNTER — Other Ambulatory Visit: Payer: Self-pay

## 2019-09-13 DIAGNOSIS — M2548 Effusion, other site: Secondary | ICD-10-CM | POA: Diagnosis not present

## 2019-09-13 DIAGNOSIS — M7989 Other specified soft tissue disorders: Secondary | ICD-10-CM | POA: Diagnosis not present

## 2019-09-13 DIAGNOSIS — S6292XA Unspecified fracture of left wrist and hand, initial encounter for closed fracture: Secondary | ICD-10-CM | POA: Diagnosis not present

## 2019-09-13 DIAGNOSIS — M79645 Pain in left finger(s): Secondary | ICD-10-CM

## 2019-09-13 DIAGNOSIS — S52602A Unspecified fracture of lower end of left ulna, initial encounter for closed fracture: Secondary | ICD-10-CM | POA: Diagnosis not present

## 2019-09-18 ENCOUNTER — Telehealth: Payer: Self-pay | Admitting: Orthopaedic Surgery

## 2019-09-18 NOTE — Telephone Encounter (Signed)
Patient called advised she is going out of town for work and asked if she can be called with the MRI results? The number to contact patient is [phone]

## 2019-09-18 NOTE — Telephone Encounter (Signed)
I did leave her a message on her phone about the MRI results.  I cannot remember if we did give her a thumb spica splint or not.  I advised her to call the office to have her pick up a left thumb spica splint a wrist thumb spica splint if we have given her 1 and she can do not need to be seen as a patient which is need to get her a splint if she does not have 1

## 2019-09-18 NOTE — Telephone Encounter (Signed)
Please advise 

## 2019-09-19 ENCOUNTER — Other Ambulatory Visit: Payer: Self-pay

## 2019-09-19 ENCOUNTER — Telehealth: Payer: Self-pay | Admitting: Orthopaedic Surgery

## 2019-09-19 DIAGNOSIS — M79645 Pain in left finger(s): Secondary | ICD-10-CM

## 2019-09-19 NOTE — Telephone Encounter (Signed)
Patient aware we have sent order to [facility]

## 2019-09-19 NOTE — Telephone Encounter (Signed)
Patient called asked what did Dr Magnus Ivan think about her seeing a hand specialist? Patient said she just need some advice on what to do at this point other than wearing the thumb spica splint. The number to contact patient is [phone]

## 2019-09-19 NOTE — Telephone Encounter (Signed)
I would have her sent to Dr. Dairl Ponder as a referral.  I did text him about her MRI and recommendations.  He would be happy to see her.  This is with the [facility] [place].

## 2019-09-19 NOTE — Telephone Encounter (Signed)
Sending to you as FYI

## 2019-09-19 NOTE — Telephone Encounter (Signed)
See below

## 2019-09-25 ENCOUNTER — Ambulatory Visit: Payer: Federal, State, Local not specified - PPO | Admitting: Orthopaedic Surgery

## 2019-09-26 DIAGNOSIS — S6992XA Unspecified injury of left wrist, hand and finger(s), initial encounter: Secondary | ICD-10-CM | POA: Diagnosis not present

## 2019-09-29 DIAGNOSIS — Z3042 Encounter for surveillance of injectable contraceptive: Secondary | ICD-10-CM | POA: Diagnosis not present

## 2019-10-11 DIAGNOSIS — S6992XA Unspecified injury of left wrist, hand and finger(s), initial encounter: Secondary | ICD-10-CM | POA: Diagnosis not present

## 2019-10-17 DIAGNOSIS — Z1159 Encounter for screening for other viral diseases: Secondary | ICD-10-CM | POA: Diagnosis not present

## 2019-10-18 DIAGNOSIS — S6992XD Unspecified injury of left wrist, hand and finger(s), subsequent encounter: Secondary | ICD-10-CM | POA: Diagnosis not present

## 2019-10-18 DIAGNOSIS — M25642 Stiffness of left hand, not elsewhere classified: Secondary | ICD-10-CM | POA: Diagnosis not present

## 2019-10-18 DIAGNOSIS — M79645 Pain in left finger(s): Secondary | ICD-10-CM | POA: Diagnosis not present

## 2019-10-18 DIAGNOSIS — S6992XA Unspecified injury of left wrist, hand and finger(s), initial encounter: Secondary | ICD-10-CM | POA: Diagnosis not present

## 2019-10-20 DIAGNOSIS — K635 Polyp of colon: Secondary | ICD-10-CM | POA: Diagnosis not present

## 2019-10-20 DIAGNOSIS — K219 Gastro-esophageal reflux disease without esophagitis: Secondary | ICD-10-CM | POA: Diagnosis not present

## 2019-10-20 DIAGNOSIS — K317 Polyp of stomach and duodenum: Secondary | ICD-10-CM | POA: Diagnosis not present

## 2019-10-20 DIAGNOSIS — Z1211 Encounter for screening for malignant neoplasm of colon: Secondary | ICD-10-CM | POA: Diagnosis not present

## 2019-10-20 DIAGNOSIS — K228 Other specified diseases of esophagus: Secondary | ICD-10-CM | POA: Diagnosis not present

## 2019-10-20 DIAGNOSIS — R140 Abdominal distension (gaseous): Secondary | ICD-10-CM | POA: Diagnosis not present

## 2019-10-20 DIAGNOSIS — F458 Other somatoform disorders: Secondary | ICD-10-CM | POA: Diagnosis not present

## 2019-11-09 DIAGNOSIS — M25642 Stiffness of left hand, not elsewhere classified: Secondary | ICD-10-CM | POA: Diagnosis not present

## 2019-11-09 DIAGNOSIS — S6992XD Unspecified injury of left wrist, hand and finger(s), subsequent encounter: Secondary | ICD-10-CM | POA: Diagnosis not present

## 2019-11-09 DIAGNOSIS — S6992XA Unspecified injury of left wrist, hand and finger(s), initial encounter: Secondary | ICD-10-CM | POA: Diagnosis not present

## 2019-11-09 DIAGNOSIS — M79645 Pain in left finger(s): Secondary | ICD-10-CM | POA: Diagnosis not present

## 2019-11-10 ENCOUNTER — Other Ambulatory Visit: Payer: Self-pay | Admitting: Orthopedic Surgery

## 2019-11-10 DIAGNOSIS — S6992XD Unspecified injury of left wrist, hand and finger(s), subsequent encounter: Secondary | ICD-10-CM | POA: Diagnosis not present

## 2019-11-17 ENCOUNTER — Other Ambulatory Visit: Payer: Self-pay

## 2019-11-17 ENCOUNTER — Encounter (HOSPITAL_BASED_OUTPATIENT_CLINIC_OR_DEPARTMENT_OTHER): Payer: Self-pay | Admitting: Orthopedic Surgery

## 2019-11-21 ENCOUNTER — Other Ambulatory Visit (HOSPITAL_COMMUNITY)
Admission: RE | Admit: 2019-11-21 | Discharge: 2019-11-21 | Disposition: A | Discharge status: Home / Self Care | Payer: Federal, State, Local not specified - PPO | Source: Ambulatory Visit | Attending: Orthopedic Surgery | Admitting: Orthopedic Surgery

## 2019-11-21 DIAGNOSIS — Z01812 Encounter for preprocedural laboratory examination: Secondary | ICD-10-CM | POA: Diagnosis not present

## 2019-11-21 DIAGNOSIS — Z20822 Contact with and (suspected) exposure to covid-19: Secondary | ICD-10-CM | POA: Insufficient documentation

## 2019-11-21 LAB — SARS CORONAVIRUS 2 (TAT 6-24 HRS): SARS Coronavirus 2: NEGATIVE

## 2019-11-21 NOTE — Progress Notes (Signed)

## 2019-11-24 ENCOUNTER — Encounter (HOSPITAL_BASED_OUTPATIENT_CLINIC_OR_DEPARTMENT_OTHER)
Admission: RE | Disposition: A | Discharge status: Home / Self Care | Payer: Self-pay | Source: Home / Self Care | Attending: Orthopedic Surgery

## 2019-11-24 ENCOUNTER — Other Ambulatory Visit: Payer: Self-pay

## 2019-11-24 ENCOUNTER — Ambulatory Visit (HOSPITAL_BASED_OUTPATIENT_CLINIC_OR_DEPARTMENT_OTHER)
Admission: RE | Admit: 2019-11-24 | Discharge: 2019-11-24 | Disposition: A | Discharge status: Home / Self Care | Payer: Federal, State, Local not specified - PPO | Attending: Orthopedic Surgery | Admitting: Orthopedic Surgery

## 2019-11-24 ENCOUNTER — Encounter (HOSPITAL_BASED_OUTPATIENT_CLINIC_OR_DEPARTMENT_OTHER): Payer: Self-pay | Admitting: Orthopedic Surgery

## 2019-11-24 ENCOUNTER — Ambulatory Visit (HOSPITAL_BASED_OUTPATIENT_CLINIC_OR_DEPARTMENT_OTHER): Payer: Federal, State, Local not specified - PPO | Admitting: Anesthesiology

## 2019-11-24 DIAGNOSIS — S63682A Other sprain of left thumb, initial encounter: Secondary | ICD-10-CM | POA: Diagnosis not present

## 2019-11-24 DIAGNOSIS — K219 Gastro-esophageal reflux disease without esophagitis: Secondary | ICD-10-CM | POA: Insufficient documentation

## 2019-11-24 DIAGNOSIS — S63642A Sprain of metacarpophalangeal joint of left thumb, initial encounter: Secondary | ICD-10-CM

## 2019-11-24 DIAGNOSIS — X58XXXA Exposure to other specified factors, initial encounter: Secondary | ICD-10-CM | POA: Diagnosis not present

## 2019-11-24 DIAGNOSIS — S63602A Unspecified sprain of left thumb, initial encounter: Secondary | ICD-10-CM | POA: Diagnosis not present

## 2019-11-24 DIAGNOSIS — S5332XA Traumatic rupture of left ulnar collateral ligament, initial encounter: Secondary | ICD-10-CM | POA: Diagnosis not present

## 2019-11-24 DIAGNOSIS — I493 Ventricular premature depolarization: Secondary | ICD-10-CM | POA: Diagnosis not present

## 2019-11-24 DIAGNOSIS — Z79899 Other long term (current) drug therapy: Secondary | ICD-10-CM | POA: Insufficient documentation

## 2019-11-24 DIAGNOSIS — F419 Anxiety disorder, unspecified: Secondary | ICD-10-CM | POA: Diagnosis not present

## 2019-11-24 HISTORY — DX: Other specified postprocedural states: Z98.890

## 2019-11-24 HISTORY — DX: Gastro-esophageal reflux disease without esophagitis: K21.9

## 2019-11-24 HISTORY — PX: ULNAR COLLATERAL LIGAMENT REPAIR: SHX6159

## 2019-11-24 HISTORY — DX: Nausea with vomiting, unspecified: R11.2

## 2019-11-24 HISTORY — DX: Other specified postprocedural states: R11.2

## 2019-11-24 LAB — POCT PREGNANCY, URINE: Preg Test, Ur: NEGATIVE

## 2019-11-24 SURGERY — REPAIR, LIGAMENT, ULNAR COLLATERAL
Anesthesia: Regional | Site: Hand | Laterality: Left

## 2019-11-24 MED ORDER — FENTANYL CITRATE (PF) 100 MCG/2ML IJ SOLN: 25.0000 ug | INTRAMUSCULAR | Status: DC | PRN

## 2019-11-24 MED ORDER — OXYCODONE HCL 5 MG PO TABS: 5.0000 mg | ORAL_TABLET | Freq: Once | ORAL | Status: DC | PRN

## 2019-11-24 MED ORDER — FENTANYL CITRATE (PF) 100 MCG/2ML IJ SOLN
INTRAMUSCULAR | Status: AC
  Filled 2019-11-24: qty 2

## 2019-11-24 MED ORDER — OXYCODONE HCL 5 MG/5ML PO SOLN: 5.0000 mg | Freq: Once | ORAL | Status: DC | PRN

## 2019-11-24 MED ORDER — CEFAZOLIN SODIUM-DEXTROSE 2-4 GM/100ML-% IV SOLN
2.0000 g | INTRAVENOUS | Status: AC
  Administered 2019-11-24: 2 g via INTRAVENOUS

## 2019-11-24 MED ORDER — CEFAZOLIN SODIUM-DEXTROSE 2-4 GM/100ML-% IV SOLN
INTRAVENOUS | Status: AC
  Filled 2019-11-24: qty 100

## 2019-11-24 MED ORDER — MIDAZOLAM HCL 2 MG/2ML IJ SOLN
1.0000 mg | Freq: Once | INTRAMUSCULAR | Status: AC
  Administered 2019-11-24: 2 mg via INTRAVENOUS

## 2019-11-24 MED ORDER — LACTATED RINGERS IV SOLN: INTRAVENOUS | Status: DC

## 2019-11-24 MED ORDER — PROMETHAZINE HCL 25 MG/ML IJ SOLN: 6.2500 mg | INTRAMUSCULAR | Status: DC | PRN

## 2019-11-24 MED ORDER — PROPOFOL 500 MG/50ML IV EMUL
INTRAVENOUS | Status: DC | PRN
  Administered 2019-11-24: 150 ug/kg/min via INTRAVENOUS

## 2019-11-24 MED ORDER — MIDAZOLAM HCL 2 MG/2ML IJ SOLN
INTRAMUSCULAR | Status: AC
  Filled 2019-11-24: qty 2

## 2019-11-24 MED ORDER — FENTANYL CITRATE (PF) 100 MCG/2ML IJ SOLN
50.0000 ug | Freq: Once | INTRAMUSCULAR | Status: AC
  Administered 2019-11-24: 100 ug via INTRAVENOUS

## 2019-11-24 MED ORDER — BUPIVACAINE-EPINEPHRINE (PF) 0.5% -1:200000 IJ SOLN
INTRAMUSCULAR | Status: DC | PRN
  Administered 2019-11-24: 30 mL via PERINEURAL

## 2019-11-24 MED ORDER — LACTATED RINGERS IV BOLUS
500.0000 mL | Freq: Once | INTRAVENOUS | Status: AC
  Administered 2019-11-24: 500 mL via INTRAVENOUS

## 2019-11-24 SURGICAL SUPPLY — 74 items
ANCH SUT 2-0 MN NDL DRL PLSTR (Anchor) ×1 IMPLANT
ANCHOR JUGGERKNOT 1.0 1DR 2-0 (Anchor) ×2 IMPLANT
APL PRP STRL LF DISP 70% ISPRP (MISCELLANEOUS) ×1
BLADE MINI RND TIP GREEN BEAV (BLADE) ×2 IMPLANT
BLADE SURG 15 STRL LF DISP TIS (BLADE) ×2 IMPLANT
BLADE SURG 15 STRL SS (BLADE) ×4
BNDG CMPR 9X4 STRL LF SNTH (GAUZE/BANDAGES/DRESSINGS) ×1
BNDG ELASTIC 2X5.8 VLCR STR LF (GAUZE/BANDAGES/DRESSINGS) IMPLANT
BNDG ELASTIC 3X5.8 VLCR STR LF (GAUZE/BANDAGES/DRESSINGS) IMPLANT
BNDG ESMARK 4X9 LF (GAUZE/BANDAGES/DRESSINGS) ×2 IMPLANT
BNDG GAUZE ELAST 4 BULKY (GAUZE/BANDAGES/DRESSINGS) ×2 IMPLANT
CATH ROBINSON RED A/P 8FR (CATHETERS) IMPLANT
CHLORAPREP W/TINT 26 (MISCELLANEOUS) ×2 IMPLANT
CORD BIPOLAR FORCEPS 12FT (ELECTRODE) ×2 IMPLANT
COVER BACK TABLE 60X90IN (DRAPES) ×2 IMPLANT
COVER MAYO STAND STRL (DRAPES) ×2 IMPLANT
COVER WAND RF STERILE (DRAPES) IMPLANT
CUFF TOURN SGL QUICK 18X4 (TOURNIQUET CUFF) ×2 IMPLANT
DECANTER SPIKE VIAL GLASS SM (MISCELLANEOUS) IMPLANT
DRAPE EXTREMITY T 121X128X90 (DISPOSABLE) ×2 IMPLANT
DRAPE OEC MINIVIEW 54X84 (DRAPES) ×2 IMPLANT
DRAPE SURG 17X23 STRL (DRAPES) ×2 IMPLANT
DRSG PAD ABDOMINAL 8X10 ST (GAUZE/BANDAGES/DRESSINGS) IMPLANT
GAUZE SPONGE 4X4 12PLY STRL (GAUZE/BANDAGES/DRESSINGS) ×2 IMPLANT
GAUZE XEROFORM 1X8 LF (GAUZE/BANDAGES/DRESSINGS) ×2 IMPLANT
GLOVE BIO SURGEON STRL SZ7.5 (GLOVE) ×2 IMPLANT
GLOVE BIOGEL PI IND STRL 8 (GLOVE) ×1 IMPLANT
GLOVE BIOGEL PI IND STRL 8.5 (GLOVE) ×1 IMPLANT
GLOVE BIOGEL PI INDICATOR 8 (GLOVE) ×1
GLOVE BIOGEL PI INDICATOR 8.5 (GLOVE) ×1
GLOVE SURG ORTHO 8.0 STRL STRW (GLOVE) ×2 IMPLANT
GOWN STRL REUS W/ TWL LRG LVL3 (GOWN DISPOSABLE) ×1 IMPLANT
GOWN STRL REUS W/TWL LRG LVL3 (GOWN DISPOSABLE) ×2
GOWN STRL REUS W/TWL XL LVL3 (GOWN DISPOSABLE) ×2 IMPLANT
K-WIRE .035X4 (WIRE) IMPLANT
NDL SAFETY ECLIPSE 18X1.5 (NEEDLE) IMPLANT
NEEDLE HYPO 18GX1.5 SHARP (NEEDLE)
NEEDLE HYPO 22GX1.5 SAFETY (NEEDLE) IMPLANT
NEEDLE HYPO 25X1 1.5 SAFETY (NEEDLE) IMPLANT
NEEDLE KEITH (NEEDLE) IMPLANT
NS IRRIG 1000ML POUR BTL (IV SOLUTION) ×2 IMPLANT
PACK BASIN DAY SURGERY FS (CUSTOM PROCEDURE TRAY) ×2 IMPLANT
PAD CAST 3X4 CTTN HI CHSV (CAST SUPPLIES) ×1 IMPLANT
PAD CAST 4YDX4 CTTN HI CHSV (CAST SUPPLIES) IMPLANT
PADDING CAST ABS 4INX4YD NS (CAST SUPPLIES) ×1
PADDING CAST ABS COTTON 4X4 ST (CAST SUPPLIES) ×1 IMPLANT
PADDING CAST COTTON 3X4 STRL (CAST SUPPLIES) ×2
PADDING CAST COTTON 4X4 STRL (CAST SUPPLIES)
PASSER SUT SWANSON 36MM LOOP (INSTRUMENTS) IMPLANT
SLEEVE SCD COMPRESS KNEE MED (MISCELLANEOUS) ×2 IMPLANT
SLING ARM FOAM STRAP LRG (SOFTGOODS) IMPLANT
SPLINT FAST PLASTER 5X30 (CAST SUPPLIES)
SPLINT PLASTER CAST FAST 5X30 (CAST SUPPLIES) IMPLANT
SPLINT PLASTER CAST XFAST 3X15 (CAST SUPPLIES) ×20 IMPLANT
SPLINT PLASTER XTRA FASTSET 3X (CAST SUPPLIES) ×20
STOCKINETTE 4X48 STRL (DRAPES) ×2 IMPLANT
SUT ETHIBOND 3-0 V-5 (SUTURE) IMPLANT
SUT ETHILON 3 0 PS 1 (SUTURE) IMPLANT
SUT ETHILON 4 0 PS 2 18 (SUTURE) ×2 IMPLANT
SUT FIBERWIRE 2-0 18 17.9 3/8 (SUTURE)
SUT FIBERWIRE 3-0 18 TAPR NDL (SUTURE)
SUT MERSILENE 2.0 SH NDLE (SUTURE) IMPLANT
SUT MERSILENE 4 0 P 3 (SUTURE) ×2 IMPLANT
SUT SILK 4 0 PS 2 (SUTURE) IMPLANT
SUT VIC AB 0 SH 27 (SUTURE) IMPLANT
SUT VIC AB 2-0 SH 27 (SUTURE)
SUT VIC AB 2-0 SH 27XBRD (SUTURE) IMPLANT
SUT VICRYL 4-0 PS2 18IN ABS (SUTURE) IMPLANT
SUTURE FIBERWR 2-0 18 17.9 3/8 (SUTURE) IMPLANT
SUTURE FIBERWR 3-0 18 TAPR NDL (SUTURE) IMPLANT
SYR BULB EAR ULCER 3OZ GRN STR (SYRINGE) ×2 IMPLANT
SYR CONTROL 10ML LL (SYRINGE) IMPLANT
TOWEL GREEN STERILE FF (TOWEL DISPOSABLE) ×4 IMPLANT
UNDERPAD 30X36 HEAVY ABSORB (UNDERPADS AND DIAPERS) ×2 IMPLANT

## 2019-11-24 NOTE — Anesthesia Preprocedure Evaluation (Addendum)
Anesthesia Evaluation  Patient identified by MRN, date of birth, ID band Patient awake    Reviewed: Allergy & Precautions, NPO status , Patient's Chart, lab work & pertinent test results  History of Anesthesia Complications (+) PONV and history of anesthetic complications  Airway Mallampati: II  TM Distance: >3 FB Neck ROM: Full    Dental  (+) Dental Advisory Given, Teeth Intact   Pulmonary neg pulmonary ROS,    Pulmonary exam normal        Cardiovascular negative cardio ROS Normal cardiovascular exam     Neuro/Psych PSYCHIATRIC DISORDERS Anxiety negative neurological ROS     GI/Hepatic Neg liver ROS, GERD  Medicated and Controlled,  Endo/Other  negative endocrine ROS  Renal/GU negative Renal ROS     Musculoskeletal negative musculoskeletal ROS (+)   Abdominal   Peds  Hematology negative hematology ROS (+)   Anesthesia Other Findings Covid test negative   Reproductive/Obstetrics                            Anesthesia Physical Anesthesia Plan  ASA: II  Anesthesia Plan: Regional   Post-op Pain Management:    Induction: Intravenous  PONV Risk Score and Plan: 3 and Propofol infusion and Treatment may vary due to age or medical condition  Airway Management Planned: Natural Airway and Simple Face Mask  Additional Equipment: None  Intra-op Plan:   Post-operative Plan:   Informed Consent: I have reviewed the patients History and Physical, chart, labs and discussed the procedure including the risks, benefits and alternatives for the proposed anesthesia with the patient or authorized representative who has indicated his/her understanding and acceptance.       Plan Discussed with: CRNA and Anesthesiologist  Anesthesia Plan Comments:        Anesthesia Quick Evaluation

## 2019-11-24 NOTE — Anesthesia Procedure Notes (Signed)
Anesthesia Regional Block: Axillary brachial plexus block   Pre-Anesthetic Checklist: ,, timeout performed, Correct Patient, Correct Site, Correct Laterality, Correct Procedure, Correct Position, site marked, Risks and benefits discussed,  Surgical consent,  Pre-op evaluation,  At surgeon's request and post-op pain management  Laterality: Left  Prep: chloraprep       Needles:  Injection technique: Single-shot  Needle Type: Echogenic Needle     Needle Length: 5cm  Needle Gauge: 21     Additional Needles:   Narrative:  Start time: 11/24/2019 12:17 PM End time: 11/24/2019 12:21 PM Injection made incrementally with aspirations every 5 mL.  Performed by: Personally  Anesthesiologist: Beryle Lathe, MD  Additional Notes: No pain on injection. No increased resistance to injection. Injection made in 5cc increments. Good needle visualization. Patient tolerated the procedure well.

## 2019-11-24 NOTE — Anesthesia Postprocedure Evaluation (Signed)
Anesthesia Post Note  Patient: Rebecca Ellison  Procedure(s) Performed: LEFT THUMB ULNAR COLLATERAL LIGAMENT REPAIR VS. RECONSTRUCTION (Left Hand)     Patient location during evaluation: PACU Anesthesia Type: Regional Level of consciousness: awake and alert Pain management: pain level controlled Vital Signs Assessment: post-procedure vital signs reviewed and stable Respiratory status: spontaneous breathing, nonlabored ventilation and respiratory function stable Cardiovascular status: stable and blood pressure returned to baseline Anesthetic complications: no   No complications documented.  Last Vitals:  Vitals:   11/24/19 1415 11/24/19 1428  BP: 109/76 124/82  Pulse: 90 89  Resp: 19 18  Temp:  36.5 C  SpO2: 100% 100%    Last Pain:  Vitals:   11/24/19 1428  TempSrc:   PainSc: 0-No pain                 Beryle Lathe

## 2019-11-24 NOTE — Transfer of Care (Signed)
Immediate Anesthesia Transfer of Care Note  Patient: Rebecca Ellison  Procedure(s) Performed: LEFT THUMB ULNAR COLLATERAL LIGAMENT REPAIR VS. RECONSTRUCTION (Left Hand)  Patient Location: PACU  Anesthesia Type:MAC combined with regional for post-op pain  Level of Consciousness: awake, alert , oriented and patient cooperative  Airway & Oxygen Therapy: Patient Spontanous Breathing and Patient connected to nasal cannula oxygen  Post-op Assessment: Report given to RN, Post -op Vital signs reviewed and stable and Patient moving all extremities  Post vital signs: Reviewed and stable  Last Vitals:  Vitals Value Taken Time  BP 117/80 11/24/19 1401  Temp    Pulse 77 11/24/19 1402  Resp 17 11/24/19 1402  SpO2 100 % 11/24/19 1402  Vitals shown include unvalidated device data.  Last Pain:  Vitals:   11/24/19 1148  TempSrc: Oral  PainSc: 3       Patients Stated Pain Goal: 5 (38/88/75 7972)  Complications: No complications documented.

## 2019-11-24 NOTE — Discharge Instructions (Addendum)

## 2019-11-24 NOTE — Op Note (Signed)
I assisted Surgeon(s) and Role:    * Betha Loa, MD - Primary    Cindee Salt, MD on the Procedure(s): LEFT THUMB ULNAR COLLATERAL LIGAMENT REPAIR VS. RECONSTRUCTION on 11/24/2019.  I provided assistance on this case as follows: Set up approach identification of the collateral ligament debridement of scar placement of anchors repair of ulnar collateral ligament repair of extensor tendon closure of the wound and application of the dressing and splint left thumb  Electronically signed by: Cindee Salt, MD Date: 11/24/2019 Time: 1:58 PM

## 2019-11-24 NOTE — Progress Notes (Signed)
Assisted Dr. Brock with left, ultrasound guided, axillary block. Side rails up, monitors on throughout procedure. See vital signs in flow sheet. Tolerated Procedure well. 

## 2019-11-24 NOTE — H&P (Signed)
Rebecca Ellison is an 49 y.o. female.   Chief Complaint: left thumb ligament injury HPI: 49 yo female with left thumb ulnar collateral ligament injury.  Has tried non operative treatment without lasting relief.  She wishes to proceed with operative repair of the ligament.  Allergies:  Allergies  Allergen Reactions  . Sulfa Antibiotics Hives    Past Medical History:  Diagnosis Date  . Anxiety   . GERD (gastroesophageal reflux disease)   . PONV (postoperative nausea and vomiting)   . PVC (premature ventricular contraction)    RVOT, ablated 2019    Past Surgical History:  Procedure Laterality Date  . ANKLE SURGERY    . KNEE SURGERY    . PVC ABLATION N/A 01/03/2018   Procedure: PVC ABLATION;  Surgeon: Marinus Maw, MD;  Location: [facility] INVASIVE CV LAB;  Service: Cardiovascular;  Laterality: N/A;    Family History: Family History  Adopted: Yes  Problem Relation Age of Onset  . Healthy Sister     Social History:   reports that she has never smoked. She has never used smokeless tobacco. She reports current alcohol use. She reports that she does not use drugs.  Medications: Medications Prior to Admission  Medication Sig Dispense Refill  . pantoprazole (PROTONIX) 20 MG tablet Take 20 mg by mouth daily.    . medroxyPROGESTERone (DEPO-PROVERA) 150 MG/ML injection Inject 150 mg into the muscle every 3 (three) months.  4    Results for orders placed or performed during the hospital encounter of 11/24/19 (from the past 48 hour(s))  Pregnancy, urine POC     Status: None   Collection Time: 11/24/19 11:37 AM  Result Value Ref Range   Preg Test, Ur NEGATIVE NEGATIVE    Comment:        THE SENSITIVITY OF THIS METHODOLOGY IS >24 mIU/mL     No results found.   A comprehensive review of systems was negative.  Blood pressure 120/74, pulse 93, temperature 98.7 F (37.1 C), temperature source Oral, resp. rate 14, height 5' 6.5" (1.689 m), weight 68.3 kg, SpO2 99 %.  General  appearance: alert, cooperative and appears stated age Head: Normocephalic, without obvious abnormality, atraumatic Neck: supple, symmetrical, trachea midline Cardio: regular rate and rhythm Resp: clear to auscultation bilaterally Extremities: Intact sensation and capillary refill all digits.  +epl/fpl/io.  No wounds.  Pulses: 2+ and symmetric Skin: Skin color, texture, turgor normal. No rashes or lesions Neurologic: Grossly normal Incision/Wound: none  Assessment/Plan Left thumb ulnar collateral ligament injury.  Plan left thumb collateral ligament repair vs reconstruction.  Non operative and operative treatment options have been discussed with the patient and patient wishes to proceed with operative treatment. Risks, benefits, and alternatives of surgery have been discussed and the patient agrees with the plan of care.   Betha Loa 11/24/2019, 12:38 PM

## 2019-11-24 NOTE — Op Note (Signed)
NAME: Arienne Gartin [facility] MEDICAL RECORD NO: [id number] DATE OF BIRTH: [birth year] FACILITY: [facility] LOCATION: [facility] PHYSICIAN: Tami Ribas, MD   OPERATIVE REPORT   DATE OF PROCEDURE: 11/24/19    PREOPERATIVE DIAGNOSIS:   Left thumb ulnar collateral ligament tear   POSTOPERATIVE DIAGNOSIS:   Left thumb ulnar collateral ligament tear   PROCEDURE:   Left thumb repair of ulnar collateral ligament   SURGEON:  Betha Loa, M.D.   ASSISTANT: Cindee Salt, MD   ANESTHESIA:  Regional with sedation   INTRAVENOUS FLUIDS:  Per anesthesia flow sheet.   ESTIMATED BLOOD LOSS:  Minimal.   COMPLICATIONS:  None.   SPECIMENS:  none   TOURNIQUET TIME:    Total Tourniquet Time Documented: Upper Arm (Left) - 28 minutes Total: Upper Arm (Left) - 28 minutes    DISPOSITION:  Stable to PACU.   INDICATIONS: 49 year old female sustained injury to her left thumb several months ago.  She is tried nonoperative treatment without adequate relief.  She continues to have pain in the thumb.  She wishes to proceed with collateral ligament repair.  She has an MRI confirming the tear. Risks, benefits and alternatives of surgery were discussed including the risks of blood loss, infection, damage to nerves, vessels, tendons, ligaments, bone for surgery, need for additional surgery, complications with wound healing, continued pain, stiffness.  She voiced understanding of these risks and elected to proceed.  OPERATIVE COURSE:  After being identified preoperatively by myself,  the patient and I agreed on the procedure and site of the procedure.  The surgical site was marked.  Surgical consent had been signed. She was given IV antibiotics as preoperative antibiotic prophylaxis. She was transferred to the operating room and placed on the operating table in supine position with the Left upper extremity on an arm board.  Sedation was induced by the anesthesiologist. A regional block had been performed  by anesthesia in preoperative holding.   Left upper extremity was prepped and draped in normal sterile orthopedic fashion.  A surgical pause was performed between the surgeons, anesthesia, and operating room staff and all were in agreement as to the patient, procedure, and site of procedure.  Tourniquet at the proximal aspect of the extremity was inflated to 250 mmHg after exsanguination of the arm with an Esmarch bandage.    Incision was made on the dorsum of the thumb at the MP joint carried in subcutaneous tissues by spreading technique.  The extensor hood and adductor aponeurosis were sharply incised for later repair.  The capsule was opened with the [facility] blade.  The collateral ligament was identified.  It had good attachment at the metacarpal head.  It was detached from the footprint at the proximal phalanx.  There was adequate tissue for repair.  The footprint was cleared off.  A juggernaut suture anchor was placed.  This was used to reapproximate the ulnar collateral ligament into the footprint at the proximal phalanx.  The abductor aponeurosis was repaired with a 4-0 Mersilene suture in a running fashion.  The wound was copiously irrigated with sterile saline.  The wound was then closed with 4-0 nylon in a horizontal mattress fashion.  Was dressed with sterile Xeroform 4 x 4's and wrapped with a Kerlix bandage.  Thumb spica splint was placed and wrapped with Kerlix and Ace bandage.  The tourniquet was deflated at 28 minutes.  Fingertips were pink with brisk capillary refill after deflation of tourniquet.  The operative  drapes were broken  down.  The patient was awoken from anesthesia safely.  She was transferred back to the stretcher and taken to PACU in stable condition.  I will see her back in the office in 1 week for postoperative followup.  She has a prescription for postoperative pain medication already.  Betha Loa, MD Electronically signed, 11/24/19

## 2019-11-28 ENCOUNTER — Encounter (HOSPITAL_BASED_OUTPATIENT_CLINIC_OR_DEPARTMENT_OTHER): Payer: Self-pay | Admitting: Orthopedic Surgery

## 2019-11-29 ENCOUNTER — Encounter (HOSPITAL_BASED_OUTPATIENT_CLINIC_OR_DEPARTMENT_OTHER): Payer: Self-pay | Admitting: Orthopedic Surgery

## 2019-12-14 DIAGNOSIS — S63642A Sprain of metacarpophalangeal joint of left thumb, initial encounter: Secondary | ICD-10-CM | POA: Diagnosis not present

## 2019-12-15 DIAGNOSIS — S63602D Unspecified sprain of left thumb, subsequent encounter: Secondary | ICD-10-CM | POA: Diagnosis not present

## 2019-12-15 DIAGNOSIS — M25642 Stiffness of left hand, not elsewhere classified: Secondary | ICD-10-CM | POA: Diagnosis not present

## 2019-12-18 DIAGNOSIS — M25642 Stiffness of left hand, not elsewhere classified: Secondary | ICD-10-CM | POA: Diagnosis not present

## 2019-12-18 DIAGNOSIS — S63602D Unspecified sprain of left thumb, subsequent encounter: Secondary | ICD-10-CM | POA: Diagnosis not present

## 2019-12-19 DIAGNOSIS — Z20822 Contact with and (suspected) exposure to covid-19: Secondary | ICD-10-CM | POA: Diagnosis not present

## 2019-12-21 DIAGNOSIS — S63602D Unspecified sprain of left thumb, subsequent encounter: Secondary | ICD-10-CM | POA: Diagnosis not present

## 2019-12-21 DIAGNOSIS — M25642 Stiffness of left hand, not elsewhere classified: Secondary | ICD-10-CM | POA: Diagnosis not present

## 2019-12-22 DIAGNOSIS — Z3042 Encounter for surveillance of injectable contraceptive: Secondary | ICD-10-CM | POA: Diagnosis not present

## 2019-12-26 DIAGNOSIS — M25642 Stiffness of left hand, not elsewhere classified: Secondary | ICD-10-CM | POA: Diagnosis not present

## 2019-12-26 DIAGNOSIS — S63602D Unspecified sprain of left thumb, subsequent encounter: Secondary | ICD-10-CM | POA: Diagnosis not present

## 2020-01-02 DIAGNOSIS — M25642 Stiffness of left hand, not elsewhere classified: Secondary | ICD-10-CM | POA: Diagnosis not present

## 2020-01-02 DIAGNOSIS — S63602D Unspecified sprain of left thumb, subsequent encounter: Secondary | ICD-10-CM | POA: Diagnosis not present

## 2020-01-05 DIAGNOSIS — S63602D Unspecified sprain of left thumb, subsequent encounter: Secondary | ICD-10-CM | POA: Diagnosis not present

## 2020-01-05 DIAGNOSIS — M25642 Stiffness of left hand, not elsewhere classified: Secondary | ICD-10-CM | POA: Diagnosis not present

## 2020-01-08 DIAGNOSIS — S63602D Unspecified sprain of left thumb, subsequent encounter: Secondary | ICD-10-CM | POA: Diagnosis not present

## 2020-01-08 DIAGNOSIS — M25642 Stiffness of left hand, not elsewhere classified: Secondary | ICD-10-CM | POA: Diagnosis not present

## 2020-01-10 DIAGNOSIS — M25642 Stiffness of left hand, not elsewhere classified: Secondary | ICD-10-CM | POA: Diagnosis not present

## 2020-01-10 DIAGNOSIS — S63602D Unspecified sprain of left thumb, subsequent encounter: Secondary | ICD-10-CM | POA: Diagnosis not present

## 2020-01-15 DIAGNOSIS — M25642 Stiffness of left hand, not elsewhere classified: Secondary | ICD-10-CM | POA: Diagnosis not present

## 2020-01-15 DIAGNOSIS — S63602D Unspecified sprain of left thumb, subsequent encounter: Secondary | ICD-10-CM | POA: Diagnosis not present

## 2020-01-18 DIAGNOSIS — S63602D Unspecified sprain of left thumb, subsequent encounter: Secondary | ICD-10-CM | POA: Diagnosis not present

## 2020-01-18 DIAGNOSIS — M25642 Stiffness of left hand, not elsewhere classified: Secondary | ICD-10-CM | POA: Diagnosis not present

## 2020-02-01 DIAGNOSIS — M25642 Stiffness of left hand, not elsewhere classified: Secondary | ICD-10-CM | POA: Diagnosis not present

## 2020-02-01 DIAGNOSIS — S63602D Unspecified sprain of left thumb, subsequent encounter: Secondary | ICD-10-CM | POA: Diagnosis not present

## 2020-02-08 DIAGNOSIS — M25642 Stiffness of left hand, not elsewhere classified: Secondary | ICD-10-CM | POA: Diagnosis not present

## 2020-02-08 DIAGNOSIS — S63602D Unspecified sprain of left thumb, subsequent encounter: Secondary | ICD-10-CM | POA: Diagnosis not present

## 2020-02-13 DIAGNOSIS — S63602D Unspecified sprain of left thumb, subsequent encounter: Secondary | ICD-10-CM | POA: Diagnosis not present

## 2020-02-13 DIAGNOSIS — M25642 Stiffness of left hand, not elsewhere classified: Secondary | ICD-10-CM | POA: Diagnosis not present

## 2020-02-15 DIAGNOSIS — S63602D Unspecified sprain of left thumb, subsequent encounter: Secondary | ICD-10-CM | POA: Diagnosis not present

## 2020-02-15 DIAGNOSIS — M25642 Stiffness of left hand, not elsewhere classified: Secondary | ICD-10-CM | POA: Diagnosis not present

## 2020-02-17 HISTORY — PX: BREAST BIOPSY: SHX20

## 2020-02-20 DIAGNOSIS — Z20822 Contact with and (suspected) exposure to covid-19: Secondary | ICD-10-CM | POA: Diagnosis not present

## 2020-02-20 DIAGNOSIS — S63602D Unspecified sprain of left thumb, subsequent encounter: Secondary | ICD-10-CM | POA: Diagnosis not present

## 2020-02-20 DIAGNOSIS — S63642D Sprain of metacarpophalangeal joint of left thumb, subsequent encounter: Secondary | ICD-10-CM | POA: Diagnosis not present

## 2020-02-20 DIAGNOSIS — U071 COVID-19: Secondary | ICD-10-CM | POA: Diagnosis not present

## 2020-02-20 DIAGNOSIS — M25642 Stiffness of left hand, not elsewhere classified: Secondary | ICD-10-CM | POA: Diagnosis not present

## 2020-02-24 DIAGNOSIS — Z20822 Contact with and (suspected) exposure to covid-19: Secondary | ICD-10-CM | POA: Diagnosis not present

## 2020-02-26 DIAGNOSIS — S63602D Unspecified sprain of left thumb, subsequent encounter: Secondary | ICD-10-CM | POA: Diagnosis not present

## 2020-02-26 DIAGNOSIS — M25642 Stiffness of left hand, not elsewhere classified: Secondary | ICD-10-CM | POA: Diagnosis not present

## 2020-03-01 DIAGNOSIS — M25642 Stiffness of left hand, not elsewhere classified: Secondary | ICD-10-CM | POA: Diagnosis not present

## 2020-03-01 DIAGNOSIS — S63602D Unspecified sprain of left thumb, subsequent encounter: Secondary | ICD-10-CM | POA: Diagnosis not present

## 2020-03-08 DIAGNOSIS — S63602D Unspecified sprain of left thumb, subsequent encounter: Secondary | ICD-10-CM | POA: Diagnosis not present

## 2020-03-08 DIAGNOSIS — M25642 Stiffness of left hand, not elsewhere classified: Secondary | ICD-10-CM | POA: Diagnosis not present

## 2020-03-13 DIAGNOSIS — D224 Melanocytic nevi of scalp and neck: Secondary | ICD-10-CM | POA: Diagnosis not present

## 2020-03-13 DIAGNOSIS — D2262 Melanocytic nevi of left upper limb, including shoulder: Secondary | ICD-10-CM | POA: Diagnosis not present

## 2020-03-13 DIAGNOSIS — Z85828 Personal history of other malignant neoplasm of skin: Secondary | ICD-10-CM | POA: Diagnosis not present

## 2020-03-13 DIAGNOSIS — D2261 Melanocytic nevi of right upper limb, including shoulder: Secondary | ICD-10-CM | POA: Diagnosis not present

## 2020-03-14 DIAGNOSIS — M25642 Stiffness of left hand, not elsewhere classified: Secondary | ICD-10-CM | POA: Diagnosis not present

## 2020-03-14 DIAGNOSIS — S63602D Unspecified sprain of left thumb, subsequent encounter: Secondary | ICD-10-CM | POA: Diagnosis not present

## 2020-03-21 DIAGNOSIS — Z3042 Encounter for surveillance of injectable contraceptive: Secondary | ICD-10-CM | POA: Diagnosis not present

## 2020-03-22 DIAGNOSIS — Z03818 Encounter for observation for suspected exposure to other biological agents ruled out: Secondary | ICD-10-CM | POA: Diagnosis not present

## 2020-03-22 DIAGNOSIS — J069 Acute upper respiratory infection, unspecified: Secondary | ICD-10-CM | POA: Diagnosis not present

## 2020-03-22 DIAGNOSIS — Z20822 Contact with and (suspected) exposure to covid-19: Secondary | ICD-10-CM | POA: Diagnosis not present

## 2020-03-29 DIAGNOSIS — S63602D Unspecified sprain of left thumb, subsequent encounter: Secondary | ICD-10-CM | POA: Diagnosis not present

## 2020-03-29 DIAGNOSIS — M25642 Stiffness of left hand, not elsewhere classified: Secondary | ICD-10-CM | POA: Diagnosis not present

## 2020-04-15 DIAGNOSIS — R42 Dizziness and giddiness: Secondary | ICD-10-CM | POA: Diagnosis not present

## 2020-04-23 ENCOUNTER — Ambulatory Visit: Payer: Federal, State, Local not specified - PPO | Admitting: Neurology

## 2020-04-23 ENCOUNTER — Encounter: Payer: Self-pay | Admitting: Neurology

## 2020-04-23 VITALS — BP 128/88 | HR 88 | Ht 66.5 in | Wt 158.0 lb

## 2020-04-23 DIAGNOSIS — H539 Unspecified visual disturbance: Secondary | ICD-10-CM | POA: Diagnosis not present

## 2020-04-23 DIAGNOSIS — E079 Disorder of thyroid, unspecified: Secondary | ICD-10-CM | POA: Diagnosis not present

## 2020-04-23 DIAGNOSIS — R799 Abnormal finding of blood chemistry, unspecified: Secondary | ICD-10-CM | POA: Diagnosis not present

## 2020-04-23 DIAGNOSIS — R700 Elevated erythrocyte sedimentation rate: Secondary | ICD-10-CM | POA: Diagnosis not present

## 2020-04-23 DIAGNOSIS — R7989 Other specified abnormal findings of blood chemistry: Secondary | ICD-10-CM | POA: Diagnosis not present

## 2020-04-23 DIAGNOSIS — R42 Dizziness and giddiness: Secondary | ICD-10-CM | POA: Insufficient documentation

## 2020-04-23 MED ORDER — TOPIRAMATE 50 MG PO TABS: 100.0000 mg | ORAL_TABLET | Freq: Every day | ORAL | 11 refills | Status: DC

## 2020-04-23 MED ORDER — RIZATRIPTAN BENZOATE 5 MG PO TBDP: 5.0000 mg | ORAL_TABLET | ORAL | 11 refills | Status: DC | PRN

## 2020-04-23 MED ORDER — ONDANSETRON 4 MG PO TBDP: 4.0000 mg | ORAL_TABLET | Freq: Three times a day (TID) | ORAL | 6 refills | Status: AC | PRN

## 2020-04-23 NOTE — Progress Notes (Signed)
Chief Complaint  Patient presents with  . New Patient (Initial Visit)    Lying: 128/88, 88, Sitting: 117/83, 98, Standing: 118/81, 98, Standing x 3: 126/88,116. Referred for episodes of vertigo and passing out. Prior to these events, she will notice ringing in her ears, left eye twitches and vision changes. Uncorrected vision today: L: 20/30-1, R: 20/30. She is under the care of cardiology. Hx of ablation.     HISTORICAL  Rebecca Ellison is a 50 year old female, seen in request by her primary care PA Jarrett Soho for evaluation of episodes of sudden onset eye twitching, visual changes, vertigo, initial evaluation was on April 23, 2020  I reviewed and summarized the referring note.PMHX. 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  She has been exercise of her life, did have recurrent similar episode since age 28s, 5-6 major episode in 1 year, most recent one was on April 13, 2020, in the afternoon, she finished her routine workout, feeling great, around 9 PM, when she was trying to get ready for bed, she had sudden onset bilateral ear louder ringing sound, dizziness, vertigo, nausea, see flashing light in bilateral visual field, vision went black, she fell to her knees, but no loss of consciousness, she has trouble seeing things, has nausea, vomiting, she was able to call her neighbor by verbal command, her neighbor was able to help her get into the bed, she denied lateralized motor or sensory sensory, went to sleep after 30 minutes, wake up next morning moderate dull achy pain at the left parietal region, skin sensitive, like it has been bruised.  This is her most typical episode, the other episode are very similar, but often milder  degree,  REVIEW OF SYSTEMS: Full 14 system review of systems performed and notable only for as above. All other review of systems were negative.  ALLERGIES: Allergies  Allergen Reactions  . Sulfa Antibiotics Hives    HOME MEDICATIONS: Current Outpatient Medications  Medication Sig Dispense Refill  . medroxyPROGESTERone (DEPO-PROVERA) 150 MG/ML injection Inject 150 mg into the muscle every 3 (three) months.  4  . pantoprazole (PROTONIX) 20 MG tablet Take 20 mg by mouth every other day.     No current facility-administered medications for this visit.    PAST MEDICAL HISTORY: Past Medical History:  Diagnosis Date  . Anxiety   . GERD (gastroesophageal reflux disease)   . PONV (postoperative nausea and vomiting)   . PVC (premature ventricular contraction)    RVOT, ablated 2019    PAST SURGICAL HISTORY: Past Surgical History:  Procedure Laterality Date  . ANKLE SURGERY    . KNEE SURGERY    . PVC ABLATION N/A 01/03/2018   Procedure: PVC ABLATION;  Surgeon: Marinus Maw, MD;  Location: [facility] INVASIVE CV LAB;  Service: Cardiovascular;  Laterality: N/A;  . ULNAR COLLATERAL LIGAMENT REPAIR Left 11/24/2019   Procedure: LEFT THUMB ULNAR COLLATERAL LIGAMENT REPAIR;  Surgeon: Betha Loa, MD;  Location: [facility];  Service: Orthopedics;  Laterality: Left;    FAMILY HISTORY: Family History  Adopted: Yes  Problem Relation Age of Onset  . Healthy Sister     SOCIAL HISTORY: Social History   Socioeconomic History  . Marital status: Single    Spouse name: Not on file  . Number of children:  0  . Years of education: college  . Highest education level: Not on file  Occupational History  . Occupation: Patent examiner - special agent  Tobacco Use  . Smoking status: Never Smoker  . Smokeless tobacco: Never Used  Vaping Use  . Vaping Use: Never used  Substance and Sexual Activity  . Alcohol use: Yes    Comment: occassionally  . Drug use: Never  . Sexual  activity: Not on file  Other Topics Concern  . Not on file  Social History Narrative   Lives alone.   Right-handed.   10-14 ounces of caffeine per day.   Social Determinants of Health   Financial Resource Strain: Not on file  Food Insecurity: Not on file  Transportation Needs: Not on file  Physical Activity: Not on file  Stress: Not on file  Social Connections: Not on file  Intimate Partner Violence: Not on file     PHYSICAL EXAM   Vitals:   04/23/20 1507  BP: 128/88  Pulse: 88  Weight: 158 lb (71.7 kg)  Height: 5' 6.5" (1.689 m)   Not recorded     Body mass index is 25.12 kg/m.  PHYSICAL EXAMNIATION:  Gen: NAD, conversant, well nourised, well groomed                     Cardiovascular: Regular rate rhythm, no peripheral edema, warm, nontender. Eyes: Conjunctivae clear without exudates or hemorrhage Neck: Supple, no carotid bruits. Pulmonary: Clear to auscultation bilaterally   NEUROLOGICAL EXAM:  MENTAL STATUS: Speech:    Speech is normal; fluent and spontaneous with normal comprehension.  Cognition:     Orientation to time, place and person     Normal recent and remote memory     Normal Attention span and concentration     Normal Language, naming, repeating,spontaneous speech     Fund of knowledge   CRANIAL NERVES: CN II: Visual fields are full to confrontation. Pupils are round equal and briskly reactive to light. CN III, IV, VI: extraocular movement are normal. No ptosis. CN V: Facial sensation is intact to light touch CN VII: Face is symmetric with normal eye closure  CN VIII: Hearing is normal to causal conversation. CN IX, X: Phonation is normal. CN XI: Head turning and shoulder shrug are intact  MOTOR: There is no pronator drift of out-stretched arms. Muscle bulk and tone are normal. Muscle strength is normal.  REFLEXES: Reflexes are 2+ and symmetric at the biceps, triceps, knees, and ankles. Plantar responses are  flexor.  SENSORY: Intact to light touch, pinprick and vibratory sensation are intact in fingers and toes.  COORDINATION: There is no trunk or limb dysmetria noted.  GAIT/STANCE: Posture is normal. Gait is steady with normal steps, base, arm swing, and turning. Heel and toe walking are normal. Tandem gait is normal.  Romberg is absent.   DIAGNOSTIC DATA (LABS, IMAGING, TESTING) - I reviewed patient records, labs, notes, testing and imaging myself where available.   ASSESSMENT AND PLAN  Ria Adrianna Dudas is a 50 y.o. female   Recurrent episode of sudden onset ringing sound, vertigo, nausea vomiting, flashing light in her visual field, followed by lateralized headache lasting for 1 to 2 days,  Most suggestive of migraine variants, differentiation diagnoses also include partial seizure, Mnire's disease  I would initiate extensive evaluation, MRI of the brain with without contrast, EEG  She complains of frequent minor similar spells almost once a week,  Start Topamax 50 titrating to  100 mg every night, Maxalt dissolvable as needed, may combine with Zofran and Aleve as needed  Levert Feinstein, M.D. Ph.D.  [facility] [address] [place], [place] [address] Ph: [phone] Fax: [phone]  CC:  Jarrett Soho, PA-C [address] [place],  [place] [address]

## 2020-04-24 ENCOUNTER — Ambulatory Visit: Payer: Federal, State, Local not specified - PPO | Admitting: Neurology

## 2020-04-24 DIAGNOSIS — R42 Dizziness and giddiness: Secondary | ICD-10-CM

## 2020-04-24 DIAGNOSIS — R55 Syncope and collapse: Secondary | ICD-10-CM | POA: Diagnosis not present

## 2020-04-24 DIAGNOSIS — H539 Unspecified visual disturbance: Secondary | ICD-10-CM

## 2020-04-24 LAB — CBC WITH DIFFERENTIAL
Basophils Absolute: 0.1 10*3/uL (ref 0.0–0.2)
Basos: 1 %
EOS (ABSOLUTE): 0.1 10*3/uL (ref 0.0–0.4)
Eos: 1 %
Hematocrit: 43 % (ref 34.0–46.6)
Hemoglobin: 14.4 g/dL (ref 11.1–15.9)
Immature Grans (Abs): 0 10*3/uL (ref 0.0–0.1)
Immature Granulocytes: 0 %
Lymphocytes Absolute: 3.6 10*3/uL — ABNORMAL HIGH (ref 0.7–3.1)
Lymphs: 36 %
MCH: 31.7 pg (ref 26.6–33.0)
MCHC: 33.5 g/dL (ref 31.5–35.7)
MCV: 95 fL (ref 79–97)
Monocytes Absolute: 0.7 10*3/uL (ref 0.1–0.9)
Monocytes: 7 %
Neutrophils Absolute: 5.3 10*3/uL (ref 1.4–7.0)
Neutrophils: 55 %
RBC: 4.54 x10E6/uL (ref 3.77–5.28)
RDW: 12 % (ref 11.7–15.4)
WBC: 9.8 10*3/uL (ref 3.4–10.8)

## 2020-04-24 LAB — COMPREHENSIVE METABOLIC PANEL
ALT: 26 IU/L (ref 0–32)
AST: 24 IU/L (ref 0–40)
Albumin/Globulin Ratio: 2.6 — ABNORMAL HIGH (ref 1.2–2.2)
Albumin: 4.9 g/dL — ABNORMAL HIGH (ref 3.8–4.8)
Alkaline Phosphatase: 62 IU/L (ref 44–121)
BUN/Creatinine Ratio: 14 (ref 9–23)
BUN: 11 mg/dL (ref 6–24)
Bilirubin Total: 0.3 mg/dL (ref 0.0–1.2)
CO2: 22 mmol/L (ref 20–29)
Calcium: 10 mg/dL (ref 8.7–10.2)
Chloride: 106 mmol/L (ref 96–106)
Creatinine, Ser: 0.77 mg/dL (ref 0.57–1.00)
Globulin, Total: 1.9 g/dL (ref 1.5–4.5)
Glucose: 88 mg/dL (ref 65–99)
Potassium: 5 mmol/L (ref 3.5–5.2)
Sodium: 142 mmol/L (ref 134–144)
Total Protein: 6.8 g/dL (ref 6.0–8.5)
eGFR: 95 mL/min/{1.73_m2} (ref >59)

## 2020-04-24 LAB — C-REACTIVE PROTEIN: CRP: <1 mg/L (ref 0–10)

## 2020-04-24 LAB — TSH: TSH: 0.953 u[IU]/mL (ref 0.450–4.500)

## 2020-04-24 LAB — ANA W/REFLEX IF POSITIVE: Anti Nuclear Antibody (ANA): NEGATIVE

## 2020-04-24 LAB — SEDIMENTATION RATE: Sed Rate: 2 mm/hr (ref 0–32)

## 2020-04-25 ENCOUNTER — Telehealth: Payer: Self-pay | Admitting: Neurology

## 2020-04-25 NOTE — Telephone Encounter (Signed)
LVM for pt to call back about scheduling MRI  [employer] fed auth: NPR Ref # [id number]

## 2020-04-25 NOTE — Telephone Encounter (Signed)
Patient returned my call. Right now she wants to hold of because of the cost and wants to speak with her cardiologist first.

## 2020-05-03 NOTE — Procedures (Signed)
   HISTORY: 50 year old female presented with episode of sudden onset vertigo, eye twitching, vision changes  TECHNIQUE:  This is a routine 16 channel EEG recording with one channel devoted to a limited EKG recording.  It was performed during wakefulness, drowsiness and asleep.  Hyperventilation and photic stimulation were performed as activating procedures.  There are minimum muscle and movement artifact noted.  Upon maximum arousal, posterior dominant waking rhythm consistent of rhythmic alpha range activity, with frequency of 10 Hz. Activities are symmetric over the bilateral posterior derivations and attenuated with eye opening.  Hyperventilation produced mild/moderate buildup with higher amplitude and the slower activities noted.  Photic stimulation did not alter the tracing.  During EEG recording, patient developed drowsiness and no deeper stage of sleep was achieved During EEG recording, there was no epileptiform discharge noted.  EKG demonstrate sinus rhythm, with heart rate of 76 bpm  CONCLUSION: This is a  normal normal awake EEG.  There is no electrodiagnostic evidence of epileptiform discharge.  Levert Feinstein, M.D. Ph.D.  [facility] [address] [place], [place] [address] Phone: [phone] Fax:      [phone]

## 2020-05-13 ENCOUNTER — Telehealth: Payer: Self-pay | Admitting: Neurology

## 2020-05-13 NOTE — Telephone Encounter (Signed)
I called the patient about this mychart message. States she was doing well on topiramate 50mg  at bedtime. Feels she had improvement in symptoms and "felt great" at that dose. She did not start having adverse side effects until she increased to 100mg  at bedtime.  She is going to drop back down to 50mg  at bedtime. If this does not control her symptoms, she will titrate up slower by taking 1.5tabs at bedtime then 2 tabs QHS as tolerated.

## 2020-05-13 NOTE — Telephone Encounter (Signed)
Pt called stating that when she started the topiramate (TOPAMAX) 50 MG tablet it worked excellent but then she was informed to increase to 100 MG and the symptoms were drastically noticeable and she was loosing her words. Pt would like to discuss with RN to be advised on what she can do about this.

## 2020-05-13 NOTE — Telephone Encounter (Signed)
Patient also sent a mychart message:    9:55 AM Note I called the patient about this mychart message. States she was doing well on topiramate 50mg  at bedtime. Feels she had improvement in symptoms and "felt great" at that dose. She did not start having adverse side effects until she increased to 100mg  at bedtime.  She is going to drop back down to 50mg  at bedtime. If this does not control her symptoms, she will titrate up slower by taking 1.5tabs at bedtime then 2 tabs QHS as tolerated.

## 2020-05-14 NOTE — Telephone Encounter (Signed)
error 

## 2020-05-17 NOTE — Telephone Encounter (Signed)
Pt called wanting to discuss her lowering the dosage to 25mg  on her topiramate (TOPAMAX) 50 MG tablet Please advise.

## 2020-05-17 NOTE — Telephone Encounter (Addendum)
I called and left the patient a message (ok per DPR). If she would like to try reducing her dose of topiramate to 25mg  at bedtime, it is okay. Hopefully, she will still get good control of her symptoms and any adverse side effects will resolve. I ask her to call me back, if she continues having issues with the medication. I provided our phone number and office hours.  This has also been reviewed with Dr. and she is in agreement with this plan.

## 2020-05-21 MED ORDER — LAMOTRIGINE 25 MG PO TABS: ORAL_TABLET | ORAL | 6 refills | Status: DC

## 2020-05-21 NOTE — Telephone Encounter (Signed)
She is only taking topiramate 50mg  at bedtime. She is still having side effects of brain fog and tingling in hands. She does not feel comfortable continuing it, especially due to her job in . She would like to discuss an alternate treatment but is very leery of trying any medications. I have spoken with Dr. Patent examiner about her concerns. She is going to call the patient back.

## 2020-05-21 NOTE — Telephone Encounter (Signed)
Pt called, want to discuss coming off the Topamax. I read that the medication can not be cut in half. Also discuss coming off the medication all together. Would like a call from the nurse.

## 2020-05-21 NOTE — Telephone Encounter (Signed)
I talk with patient, after she backed down Topamax dosage from 100 mg to 50 mg every night, her side effect seems to decrease, she is less dizzy, but still has intermittent finger paresthesia, also complains of being forgetful,   Update extensive discussion with patient, we decided to stop topomax on April 5th, proceed with lamotrigine 25 mg every night on April 18th, and titrating to 50 mg every night as preventive medication for her possible migraine variant versus Mnire's disease  She has not had recurrent similar spells since last visit on April 23, 2020, she did complain some dizziness and woozy headedness while on higher dose of Topamax 100 mg daily, took Zofran, was not sure of its benefit, she has never tried Maxalt  Meds ordered this encounter  Medications  . lamoTRIgine (LAMICTAL) 25 MG tablet    Sig: One tab qhs for one week; then 2 tabs qhs    Dispense:  60 tablet    Refill:  6

## 2020-05-21 NOTE — Addendum Note (Signed)
Addended by: Levert Feinstein on: 05/21/2020 05:17 PM   Modules accepted: Orders

## 2020-05-28 DIAGNOSIS — R61 Generalized hyperhidrosis: Secondary | ICD-10-CM | POA: Diagnosis not present

## 2020-05-28 DIAGNOSIS — Z01419 Encounter for gynecological examination (general) (routine) without abnormal findings: Secondary | ICD-10-CM | POA: Diagnosis not present

## 2020-06-13 ENCOUNTER — Other Ambulatory Visit: Payer: Self-pay | Admitting: Obstetrics and Gynecology

## 2020-06-13 ENCOUNTER — Other Ambulatory Visit (HOSPITAL_COMMUNITY): Payer: Self-pay

## 2020-06-13 DIAGNOSIS — Z3042 Encounter for surveillance of injectable contraceptive: Secondary | ICD-10-CM | POA: Diagnosis not present

## 2020-06-13 DIAGNOSIS — E2839 Other primary ovarian failure: Secondary | ICD-10-CM

## 2020-06-13 DIAGNOSIS — Z1231 Encounter for screening mammogram for malignant neoplasm of breast: Secondary | ICD-10-CM

## 2020-06-13 MED ORDER — MEDROXYPROGESTERONE ACETATE 150 MG/ML IM SUSP
INTRAMUSCULAR | 1 refills | Status: DC
  Filled 2020-06-13: qty 1, 90d supply, fill #0

## 2020-07-02 DIAGNOSIS — R42 Dizziness and giddiness: Secondary | ICD-10-CM | POA: Diagnosis not present

## 2020-07-02 DIAGNOSIS — H903 Sensorineural hearing loss, bilateral: Secondary | ICD-10-CM | POA: Diagnosis not present

## 2020-07-11 ENCOUNTER — Ambulatory Visit: Payer: Federal, State, Local not specified - PPO | Admitting: Neurology

## 2020-07-11 DIAGNOSIS — R42 Dizziness and giddiness: Secondary | ICD-10-CM | POA: Diagnosis not present

## 2020-07-16 ENCOUNTER — Ambulatory Visit: Payer: Federal, State, Local not specified - PPO | Admitting: Neurology

## 2020-07-16 ENCOUNTER — Encounter: Payer: Self-pay | Admitting: Neurology

## 2020-07-16 ENCOUNTER — Other Ambulatory Visit: Payer: Self-pay

## 2020-07-16 VITALS — BP 121/86 | HR 84 | Ht 67.0 in | Wt 153.0 lb

## 2020-07-16 DIAGNOSIS — H539 Unspecified visual disturbance: Secondary | ICD-10-CM

## 2020-07-16 DIAGNOSIS — R42 Dizziness and giddiness: Secondary | ICD-10-CM

## 2020-07-16 NOTE — Progress Notes (Signed)
Chief Complaint  Patient presents with  . Follow-up    Rm 15, alone, states the Lamictal causes acid reflux and her sleep is worse     HISTORICAL  Rebecca Ellison is a 50 year old female, seen in request by her primary care PA Jarrett Soho for evaluation of episodes of sudden onset eye twitching, visual changes, vertigo, initial evaluation was on April 23, 2020  I reviewed and summarized the referring note.PMHX. 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  She has been exercise of her life, did have recurrent similar episode since age 60s, 5-6 major episode in 1 year, most recent one was on April 13, 2020, in the afternoon, she finished her routine workout, feeling great, around 9 PM, when she was trying to get ready for bed, she had sudden onset bilateral ear louder ringing sound, dizziness, vertigo, nausea, see flashing light in bilateral visual field, vision went black, she fell to her knees, but no loss of consciousness, she has trouble seeing things, has nausea, vomiting, she was able to call her neighbor by verbal command, her neighbor was able to help her get into the bed, she denied lateralized motor or sensory sensory, went to sleep after 30 minutes, wake up next morning moderate dull achy pain at the left parietal region, skin sensitive, like it has been bruised.  This is her most typical episode, the other episode are very similar, but often milder degree,  Update Jul 16, 2020 SS: Here today alone, At last visit was started on Topamax, her symptoms responded well to 100 mg, but had side effect, even at 50 mg had brain fog and extremity paresthesia.  Topamax was stopped on April 5, started Lamictal titration  EEG was normal in March  2022.  Has had vertigo and fainting since her 20's. 1 time fainted in front of fire chief, heart was irregular, saw cardiology, on treadmill stress test was normal in [place]. Has had cervical ESIs, surgery to right shoulder biceps, in recovery from that, cardiac issue, HR was 35, work-up in [place], was lightheaded, fainted a few times. On a lot of medications, had ablation from Dr. Ladona Ridgel in 2019, Dr. Ladona Ridgel cleared her heart, episodes are not cardiac related. Went to PCP, having burps, chest pain, saw GI, had endoscopy, colonoscopy, went on pantoprazole, still having dizziness, ringing in ears, vomiting.   When saw Dr. Terrace Arabia, start with ringing in ears several times a day, then develop dizziness, if couldn't lay down, then get nauseated, vomiting, then get to point of vision blurry, would last several days, on and off, wouldn't want to drive. Was taking meclizine, would usually help, never had headache.   Works for [employer], travels Designer, jewellery. Just saw Dr. Suszanne Conners, just had extensive testing for balance, gets results this week.   Currently on Lamictal 50 mg at bedtime, no longer ringing in ears, of symptoms with spells. Taken meclizine twice. Has learned spells are generally during high stress time with career. [place] going to move to [place], for 2 year assignment. Has noted Lamictal causes acid reflux, waking up during night with it. Feels 90% better with Lamictal. Has not had MRI of the brain, was going to cost $600.   REVIEW OF SYSTEMS: Full 14 system review of systems performed and notable only for as  above.  See HPI  ALLERGIES: Allergies  Allergen Reactions  . Sulfa Antibiotics Hives    HOME MEDICATIONS: Current Outpatient Medications  Medication Sig Dispense Refill  . lamoTRIgine (LAMICTAL) 25 MG tablet One tab qhs for one week; then 2 tabs qhs 60 tablet 6  . medroxyPROGESTERone (DEPO-PROVERA) 150 MG/ML injection Inject 150 mg into the muscle every 3 (three) months.  4  . ondansetron (ZOFRAN  ODT) 4 MG disintegrating tablet Take 1 tablet (4 mg total) by mouth every 8 (eight) hours as needed. 20 tablet 6  . rizatriptan (MAXALT-MLT) 5 MG disintegrating tablet Take 1 tablet (5 mg total) by mouth as needed. May repeat in 2 hours if needed 12 tablet 11   No current facility-administered medications for this visit.    PAST MEDICAL HISTORY: Past Medical History:  Diagnosis Date  . Anxiety   . GERD (gastroesophageal reflux disease)   . PONV (postoperative nausea and vomiting)   . PVC (premature ventricular contraction)    RVOT, ablated 2019    PAST SURGICAL HISTORY: Past Surgical History:  Procedure Laterality Date  . ANKLE SURGERY    . KNEE SURGERY    . PVC ABLATION N/A 01/03/2018   Procedure: PVC ABLATION;  Surgeon: Marinus Maw, MD;  Location: [facility] INVASIVE CV LAB;  Service: Cardiovascular;  Laterality: N/A;  . ULNAR COLLATERAL LIGAMENT REPAIR Left 11/24/2019   Procedure: LEFT THUMB ULNAR COLLATERAL LIGAMENT REPAIR;  Surgeon: Betha Loa, MD;  Location: [facility];  Service: Orthopedics;  Laterality: Left;    FAMILY HISTORY: Family History  Adopted: Yes  Problem Relation Age of Onset  . Healthy Sister     SOCIAL HISTORY: Social History   Socioeconomic History  . Marital status: Single    Spouse name: Not on file  . Number of children: 0  . Years of education: college  . Highest education level: Not on file  Occupational History  . Occupation: Patent examiner - special agent  Tobacco Use  . Smoking status: Never Smoker  . Smokeless tobacco: Never Used  Vaping Use  . Vaping Use: Never used  Substance and Sexual Activity  . Alcohol use: Yes    Comment: occassionally  . Drug use: Never  . Sexual activity: Not on file  Other Topics Concern  . Not on file  Social History Narrative   Lives alone.   Right-handed.   10-14 ounces of caffeine per day.   Social Determinants of Health   Financial Resource Strain: Not on file  Food Insecurity:  Not on file  Transportation Needs: Not on file  Physical Activity: Not on file  Stress: Not on file  Social Connections: Not on file  Intimate Partner Violence: Not on file     PHYSICAL EXAM   Vitals:   07/16/20 1320  BP: 121/86  Pulse: 84  Weight: 153 lb (69.4 kg)  Height: 5\' 7"  (1.702 m)   Not recorded     Physical Exam  General: The patient is alert and cooperative at the time of the examination.  Skin: No significant peripheral edema is noted.  Neurologic Exam  Mental status: The patient is alert and oriented x 3 at the time of the examination. The patient has apparent normal recent and remote memory, with an apparently normal attention span and concentration ability.   Cranial nerves: Facial symmetry is present. Speech is normal, no aphasia or dysarthria is noted. Extraocular movements are full. Visual fields are full.  Motor: The patient has good  strength in all 4 extremities.  Sensory examination: Soft touch sensation is symmetric on the face, arms, and legs.  Coordination: The patient has good finger-nose-finger and heel-to-shin bilaterally.  Gait and station: The patient has a normal gait. Tandem gait is normal.   Reflexes: Deep tendon reflexes are symmetric and normal.  DIAGNOSTIC DATA (LABS, IMAGING, TESTING) - I reviewed patient records, labs, notes, testing and imaging myself where available.  ASSESSMENT AND PLAN  Duchess Shametra Cumberland is a 50 y.o. female   1. Recurrent episode of sudden onset ringing sound, vertigo, nausea vomiting, flashing light in her visual field, followed by lateralized headache lasting for 1 to 2 days,  -EEG was normal in March 2022 -MRI of the brain has not been completed, is quite expensive -Symptoms felt most suggestive of migraine variant, DD includes partial seizure, Mnire's disease -90% improvement in symptoms, no significant spells since February, with Topamax then Lamictal addition  -Adverse effect of Topamax, has  been on Lamictal 50 mg at bedtime since April 2022 -Spells much improved, but does report side effect of acid reflux at night, after taking Lamictal  -Suggest she move the Lamictal to AM to see if less side effect, if improvement, could try higher dose, still has a few mild symptom of spells but nothing as significant -Saw ENT, results should be available on Thursday, encouraged her to reach out via MyChart to let me know -She will be moving to [place] in September, follow-up with Dr. Terrace Arabia in 3 months before her move -Can continue Maxalt, along with Zofran for acute spell  I spent 34 minutes of face-to-face and non-face-to-face time with patient.  This included previsit chart review, lab review, study review, discussing history, specifics of spells, reviewing results with her, discussing lamictal, other medications going forward, side effects, follow-up in 3 months before her move.  Otila Kluver, DNP  [facility] [address], [address] [place], [place] [address] [phone]

## 2020-07-16 NOTE — Patient Instructions (Signed)
For now continue the Lamictal, may try taking in the morning to see if less side effect  We could increase the dose, further, let's wait to see what ENT results are, send my chart message Come back in 3 months

## 2020-07-19 DIAGNOSIS — H832X1 Labyrinthine dysfunction, right ear: Secondary | ICD-10-CM | POA: Diagnosis not present

## 2020-07-19 DIAGNOSIS — R42 Dizziness and giddiness: Secondary | ICD-10-CM | POA: Diagnosis not present

## 2020-07-19 DIAGNOSIS — H903 Sensorineural hearing loss, bilateral: Secondary | ICD-10-CM | POA: Diagnosis not present

## 2020-07-21 ENCOUNTER — Encounter: Payer: Self-pay | Admitting: Neurology

## 2020-07-22 MED ORDER — LAMOTRIGINE 25 MG PO TABS: ORAL_TABLET | ORAL | 6 refills | Status: DC

## 2020-07-22 NOTE — Telephone Encounter (Signed)
Lamictal dosing increased to 75 mg at bedtime. See my chart message below.   Hi Dr. Christia Reading,  I apologize for just getting these to you.  Please share with Dr. Terrace Arabia as well - looks like MyChart will only let me send to one person.  Basically, Dr. Suszanne Conners told me I could probably benefit from some "physical therapy" for the issue AND to keep taking the medicine as I told him the ringing in my ears has decreased significantly since I started taking it.    I started taking the medicine about 2 hours earlier in the evening and that seems to have fixed the reflux issue!  I know this is bad, but I googled "can lamotrigine cause acid reflux" and there seems to be some research leading to:  After taking a dose of Lamictal, patients can experience an unusual burning sensation in the upper abdomen or chest.    I'm still wondering about uping the dose?? Only because I still have ringing and I'm thinking the amount of stress/chaos in my life may be causing it?  If I increase, could I decrease in October/November when everything normalizes again?    Thank you for everything!  Rebecca Ellison

## 2020-08-13 ENCOUNTER — Encounter: Payer: Self-pay | Admitting: Physical Therapy

## 2020-08-13 ENCOUNTER — Other Ambulatory Visit: Payer: Self-pay

## 2020-08-13 ENCOUNTER — Ambulatory Visit: Payer: Federal, State, Local not specified - PPO | Attending: Otolaryngology | Admitting: Physical Therapy

## 2020-08-13 DIAGNOSIS — R2681 Unsteadiness on feet: Secondary | ICD-10-CM | POA: Diagnosis not present

## 2020-08-13 DIAGNOSIS — R42 Dizziness and giddiness: Secondary | ICD-10-CM | POA: Diagnosis not present

## 2020-08-13 NOTE — Therapy (Signed)
[facility] [address] [place], [place], [address] Phone: [phone]   Fax:  [phone]  Physical Therapy Evaluation  Patient Details  Name: Rebecca Ellison MRN: [id number] Date of Birth: 07-Sep-1970 Referring Provider (PT): Newman Pies, MD   Encounter Date: 08/13/2020   PT End of Session - 08/13/20 2048     Visit Number 1    Number of Visits 9    Date for PT Re-Evaluation 10/12/20    Authorization Type [employer]; $30 copay.  VL: 43 remaining out of 50    Authorization - Visit Number 1    Authorization - Number of Visits 43    PT Start Time 1445    PT Stop Time 1530    PT Time Calculation (min) 45 min    Activity Tolerance Patient tolerated treatment well    Behavior During Therapy WFL for tasks assessed/performed             Past Medical History:  Diagnosis Date   Anxiety    GERD (gastroesophageal reflux disease)    PONV (postoperative nausea and vomiting)    PVC (premature ventricular contraction)    RVOT, ablated 2019    Past Surgical History:  Procedure Laterality Date   ANKLE SURGERY     KNEE SURGERY     PVC ABLATION N/A 01/03/2018   Procedure: PVC ABLATION;  Surgeon: Marinus Maw, MD;  Location: [facility] INVASIVE CV LAB;  Service: Cardiovascular;  Laterality: N/A;   ULNAR COLLATERAL LIGAMENT REPAIR Left 11/24/2019   Procedure: LEFT THUMB ULNAR COLLATERAL LIGAMENT REPAIR;  Surgeon: Betha Loa, MD;  Location: [facility];  Service: Orthopedics;  Laterality: Left;    There were no vitals filed for this visit.    Subjective Assessment - 08/13/20 1456     Subjective Pt first experienced dizziness and fainting 20 years ago but was more cardiac related; after having an ablation pt continued to have dizziness/lightheadedness.  For the past year pt has experienced severe episode of spinning, nausea and vomiting.  Pt feels better but exhausted after vomiting.  Referred to neurology and ENT for  ongoing dizziness.  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  Pt placed on Topamax but not able to tolerate.  Changed to Lamictal which pt has been able to tolerate and decreased episodes of spinning; still has tinnitus.  ENT found R side vestibular loss.  Referred to Vestibular rehab.    Pertinent History anxiety, GERD, PVC, ankle surgery, knee surgery, PVC ablation, L ulnar collateral ligament repair, vestibular migraines    Diagnostic tests vestibular testing    Patient Stated Goals To be able to get back to working out - weight lifting, cardio, crossfit trainer    Currently in Pain? No/denies                [facility] PT Assessment - 08/13/20 1511       Assessment   Medical Diagnosis Recurrent vertigo    Referring Provider (PT) Newman Pies, MD    Onset Date/Surgical Date 08/07/20    Prior Therapy after orthopedic surgeries      Precautions   Precautions Other (comment)    Precaution Comments anxiety, GERD, PVC, ankle surgery, knee surgery, PVC ablation, L ulnar collateral ligament repair, vestibular migraines      Balance Screen   Has the patient fallen in the past 6 months Yes    How many times? 1    Has the patient had a decrease  in activity level because of a fear of falling?  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  Is being moved to [place] in September    Leisure working out      Observation/Other Assessments   Focus on Therapeutic Outcomes (FOTO)  Dizziness Functional Status: 54; Dizziness Positional Status: 69.8 (WFL)      Sensation   Light Touch Appears Intact      ROM / Strength   AROM / PROM / Strength Strength      Strength   Overall Strength Within functional limits  for tasks performed      Ambulation/Gait   Ambulation/Gait Yes    Ambulation/Gait Assistance 7: Independent                    Vestibular Assessment - 08/13/20 1518       Symptom Behavior   Subjective history of current problem less episodes; tinnitus 2-3x/week; episodes of dizziness are now less than one a week    Type of Dizziness  Spinning    Frequency of Dizziness intermittent    Duration of Dizziness once she takes medication, symptoms resolve    Symptom Nature Spontaneous    Aggravating Factors Spontaneous onset    Relieving Factors Medication    Progression of Symptoms Better      Oculomotor Exam   Oculomotor Alignment Normal    Ocular ROM WFL    Spontaneous Absent    Gaze-induced  Absent    Smooth Pursuits Intact   mild symptoms   Saccades Intact    Comment Convergence impaired      Oculomotor Exam-Fixation Suppressed    Left Head Impulse negative    Right Head Impulse negative      Vestibulo-Ocular Reflex   VOR to Slow Head Movement Normal    VOR Cancellation Normal   mild symptoms     Visual Acuity   Static 7    Dynamic 6                Objective measurements completed on examination: See above findings.               PT Education - 08/13/20 2047     Education Details clinical findings, PT POC, and goals; purpose of slow progression of treatment with vestibular migraines    Person(s) Educated Patient    Methods Explanation    Comprehension Verbalized understanding                 PT Long Term Goals - 08/13/20 2104       PT LONG TERM GOAL #1   Title Pt will increase DFS to >/= 58    Baseline DFS: 54; DPS 69 (WFL)    Time 8    Period Weeks    Status New    Target Date 10/12/20      PT LONG TERM GOAL #2   Title Pt will demonstrate independence with final vestibular HEP and will report ability to return to exercise and weight lifting    Time 8    Period Weeks    Status New    Target Date  10/12/20      PT  LONG TERM GOAL #3   Title Pt will report 0/5 dizziness on MSQ    Time 8    Period Weeks    Status New    Target Date 10/12/20      PT LONG TERM GOAL #4   Title Pt will report 25% reduction in episodes of dizziness due to ID of migraine triggers and lifestyle modification    Baseline ~1x/week    Time 8    Period Weeks    Status New    Target Date 10/12/20                    Plan - 08/13/20 2049     Clinical Impression Statement Pt is a 49 year old female referred to Neuro OPPT for evaluation of recurrent vertigo.  Pt's PMH is significant for the following: anxiety, GERD, PVC, ankle surgery, knee surgery, PVC ablation, L ulnar collateral ligament repair, vestibular migraines. The following deficits were noted during pt's exam: disequilibrium, visual motion sensitivity and impaired balance.  Pt lives alone, works in Patent examiner and would like to be able to return to regular exercising and weight lifting.  Pt would benefit from skilled PT to address these impairments and functional limitations to maximize functional mobility independence and reduce falls risk.    Personal Factors and Comorbidities Comorbidity 3+;Past/Current Experience;Profession    Comorbidities anxiety, GERD, PVC, ankle surgery, knee surgery, PVC ablation, L ulnar collateral ligament repair, vestibular migraines    Examination-Activity Limitations Other   exercise   Examination-Participation Restrictions Community Activity;Driving;Occupation    Stability/Clinical Decision Making Stable/Uncomplicated    Clinical Decision Making Low    Rehab Potential Good    PT Frequency 1x / week    PT Duration 8 weeks    PT Treatment/Interventions ADLs/Self Care Home Management;Canalith Repostioning;Functional mobility training;Therapeutic activities;Therapeutic exercise;Neuromuscular re-education;Balance training;Patient/family education;Vestibular    PT Next Visit Plan rule out BPPV, MSQ.  Initiate HEP for habituation;  return to weight lifting and exercise    Consulted and Agree with Plan of Care Patient             Patient will benefit from skilled therapeutic intervention in order to improve the following deficits and impairments:  Dizziness, Decreased balance  Visit Diagnosis: Dizziness and giddiness  Unsteadiness on feet     Problem List Patient Active Problem List   Diagnosis Date Noted   Vision changes 04/23/2020   Vertigo 04/23/2020   Bucket-handle tear of medial meniscus of left knee as current injury 01/24/2019   PVC (premature ventricular contraction) 01/03/2018    Dierdre Highman, PT, DPT 08/13/20    9:10 PM    [facility] [address] [place], [place], [address] Phone: [phone]   Fax:  [phone]  Name: Rebecca Ellison MRN: [id number] Date of Birth: 1970-09-01

## 2020-08-22 ENCOUNTER — Ambulatory Visit: Payer: Federal, State, Local not specified - PPO | Attending: Otolaryngology | Admitting: Physical Therapy

## 2020-08-22 ENCOUNTER — Other Ambulatory Visit: Payer: Self-pay

## 2020-08-22 DIAGNOSIS — R2681 Unsteadiness on feet: Secondary | ICD-10-CM | POA: Diagnosis not present

## 2020-08-22 DIAGNOSIS — R42 Dizziness and giddiness: Secondary | ICD-10-CM | POA: Insufficient documentation

## 2020-08-22 NOTE — Therapy (Signed)
[facility] [address] [place], [place], [address] Phone: [phone]   Fax:  [phone]  Physical Therapy Treatment  Patient Details  Name: Rebecca Ellison MRN: [id number] Date of Birth: 07-20-1970 Referring Provider (PT): Newman Pies, MD   Encounter Date: 08/22/2020   PT End of Session - 08/22/20 1028     Visit Number 2    Number of Visits 9    Date for PT Re-Evaluation 10/12/20    Authorization Type BCBS; $30 copay.  VL: 43 remaining out of 50    Authorization - Visit Number 2    Authorization - Number of Visits 43    PT Start Time 1024    PT Stop Time 1108    PT Time Calculation (min) 44 min    Activity Tolerance Patient tolerated treatment well    Behavior During Therapy WFL for tasks assessed/performed             Past Medical History:  Diagnosis Date   Anxiety    GERD (gastroesophageal reflux disease)    PONV (postoperative nausea and vomiting)    PVC (premature ventricular contraction)    RVOT, ablated 2019    Past Surgical History:  Procedure Laterality Date   ANKLE SURGERY     KNEE SURGERY     PVC ABLATION N/A 01/03/2018   Procedure: PVC ABLATION;  Surgeon: Marinus Maw, MD;  [facility];  Service: Cardiovascular;  Laterality: N/A;   ULNAR COLLATERAL LIGAMENT REPAIR Left 11/24/2019   Procedure: LEFT THUMB ULNAR COLLATERAL LIGAMENT REPAIR;  Surgeon: Betha Loa, MD;  Location: [facility];  Service: Orthopedics;  Laterality: Left;    There were no vitals filed for this visit.   Subjective Assessment - 08/22/20 1030     Subjective Pt has not had any serious episodes of dizziness; when under increased stress she experiences ringing in ears.  If pt can catch the episode early and calm herself it doesn't get as severe.    Pertinent History anxiety, GERD, PVC, ankle surgery, knee surgery, PVC ablation, L ulnar collateral ligament repair, vestibular migraines     Diagnostic tests vestibular testing    Patient Stated Goals To be able to get back to working out - weight lifting, cardio, crossfit trainer    Currently in Pain? No/denies                [facility] PT Assessment - 08/22/20 1101       Functional Gait  Assessment   Gait assessed  Yes    Gait Level Surface Walks 20 ft in less than 5.5 sec, no assistive devices, good speed, no evidence for imbalance, normal gait pattern, deviates no more than 6 in outside of the 12 in walkway width.    Change in Gait Speed Able to smoothly change walking speed without loss of balance or gait deviation. Deviate no more than 6 in outside of the 12 in walkway width.    Gait with Horizontal Head Turns Performs head turns smoothly with no change in gait. Deviates no more than 6 in outside 12 in walkway width    Gait with Vertical Head Turns Performs head turns with no change in gait. Deviates no more than 6 in outside 12 in walkway width.    Gait and Pivot Turn Pivot turns safely within 3 sec and stops quickly with no loss of balance.    Step Over Obstacle Is able to step over 2  stacked shoe boxes taped together (9 in total height) without changing gait speed. No evidence of imbalance.    Gait with Narrow Base of Support Is able to ambulate for 10 steps heel to toe with no staggering.    Gait with Eyes Closed Walks 20 ft, no assistive devices, good speed, no evidence of imbalance, normal gait pattern, deviates no more than 6 in outside 12 in walkway width. Ambulates 20 ft in less than 7 sec.    Ambulating Backwards Walks 20 ft, no assistive devices, good speed, no evidence for imbalance, normal gait    Steps Alternating feet, no rail.    Total Score 30    FGA comment: 30/30                 Vestibular Assessment - 08/22/20 1034       Positional Testing   Dix-Hallpike Dix-Hallpike Left;Dix-Hallpike Right    Horizontal Canal Testing Horizontal Canal Right;Horizontal Canal Left      Dix-Hallpike Right    Dix-Hallpike Right Duration 0    Dix-Hallpike Right Symptoms No nystagmus      Dix-Hallpike Left   Dix-Hallpike Left Duration 0    Dix-Hallpike Left Symptoms No nystagmus      Horizontal Canal Right   Horizontal Canal Right Duration 0    Horizontal Canal Right Symptoms Normal      Horizontal Canal Left   Horizontal Canal Left Duration 0    Horizontal Canal Left Symptoms Normal      Balancemaster   Balancemaster Comment MCTSIB: 30 seconds for all 4 conditions with mild sway      Positional Sensitivities   Sit to Supine No dizziness    Supine to Left Side No dizziness    Supine to Right Side No dizziness    Supine to Sitting No dizziness    Right Hallpike No dizziness    Up from Right Hallpike No dizziness    Up from Left Hallpike No dizziness    Nose to Right Knee No dizziness    Right Knee to Sitting No dizziness    Nose to Left Knee No dizziness    Left Knee to Sitting No dizziness    Head Turning x 5 No dizziness    Head Nodding x 5 No dizziness    Pivot Right in Standing No dizziness    Pivot Left in Standing No dizziness    Rolling Right No dizziness    Rolling Left No dizziness                              PT Education - 08/22/20 1132     Education Details Handout for vestibular migraines, trigger ID, food diary    Person(s) Educated Patient    Methods Explanation;Handout    Comprehension Verbalized understanding                 PT Long Term Goals - 08/22/20 1125       PT LONG TERM GOAL #1   Title Pt will increase DFS to >/= 58    Baseline DFS: 54; DPS 69 (WFL)    Time 8    Period Weeks    Status New    Target Date 10/12/20      PT LONG TERM GOAL #2   Title Pt will demonstrate independence with final vestibular HEP and will report ability to return to exercise and weight lifting    Time  8    Period Weeks    Status New    Target Date 10/12/20      PT LONG TERM GOAL #3   Title Pt will report 0/5 dizziness on MSQ     Baseline 0/5 for all movements, goal achieved    Status Achieved      PT LONG TERM GOAL #4   Title Pt will report 25% reduction in episodes of dizziness due to ID of migraine triggers and lifestyle modification    Baseline ~1x/week    Time 8    Period Weeks    Status New    Target Date 10/12/20                   Plan - 08/22/20 1126     Clinical Impression Statement Continued to assess vestibular impairments including screening for positiona vertigo, motion sensitivity with MSQ and balance deficits with FGA and MCTSIB.  All WFL and pt reporting no symptoms of dizziness or increased tinnitus.  Pt did report slight pressure on R side of head after performing FGA but no dizziness.  Reviewed educational handout on vestibular migraines and began to discuss use of food diary to see if there are any food triggers; may also benefit from strategies to improve stress relief (body scans, breathing, guided imagery).    Personal Factors and Comorbidities Comorbidity 3+;Past/Current Experience;Profession    Comorbidities anxiety, GERD, PVC, ankle surgery, knee surgery, PVC ablation, L ulnar collateral ligament repair, vestibular migraines    Examination-Activity Limitations Other   exercise   Examination-Participation Restrictions Community Activity;Driving;Occupation    Stability/Clinical Decision Making Stable/Uncomplicated    Rehab Potential Good    PT Frequency 1x / week    PT Duration 8 weeks    PT Treatment/Interventions ADLs/Self Care Home Management;Canalith Repostioning;Functional mobility training;Therapeutic activities;Therapeutic exercise;Neuromuscular re-education;Balance training;Patient/family education;Vestibular    PT Next Visit Plan Initiate HEP for habituation focusing on balance on compliant surfaces and vision removed; return to weight lifting and exercise routine.  Stress management techniques: breathing, body scans, guided imagery    Consulted and Agree with Plan of Care  Patient             Patient will benefit from skilled therapeutic intervention in order to improve the following deficits and impairments:  Dizziness, Decreased balance  Visit Diagnosis: Dizziness and giddiness  Unsteadiness on feet     Problem List Patient Active Problem List   Diagnosis Date Noted   Vision changes 04/23/2020   Vertigo 04/23/2020   Bucket-handle tear of medial meniscus of left knee as current injury 01/24/2019   PVC (premature ventricular contraction) 01/03/2018   Dierdre Highman, PT, DPT 08/22/20    11:34 AM    [facility] [address] [place], [place], [address] Phone: [phone]   Fax:  [phone]  Name: Rebecca Ellison MRN: [id number] Date of Birth: [birth year]

## 2020-08-22 NOTE — Patient Instructions (Signed)
[email or web address]  [facility] Vestibular Migraine Handout

## 2020-08-23 ENCOUNTER — Ambulatory Visit: Payer: Federal, State, Local not specified - PPO | Admitting: Physician Assistant

## 2020-08-23 ENCOUNTER — Ambulatory Visit: Payer: Self-pay

## 2020-08-23 ENCOUNTER — Ambulatory Visit: Payer: Federal, State, Local not specified - PPO | Admitting: Family

## 2020-08-23 ENCOUNTER — Encounter: Payer: Self-pay | Admitting: Physician Assistant

## 2020-08-23 DIAGNOSIS — M84374A Stress fracture, right foot, initial encounter for fracture: Secondary | ICD-10-CM

## 2020-08-23 DIAGNOSIS — M79671 Pain in right foot: Secondary | ICD-10-CM

## 2020-08-23 NOTE — Progress Notes (Signed)
Office Visit Note   Patient: Rebecca Ellison           Date of Birth: [birth year]           MRN: [id number] Visit Date: 08/23/2020              Requested by: Jarrett Soho, PA-C [address] [place],  [place] [address] PCP: Jarrett Soho, PA-C  No chief complaint on file.     HPI: Patient is an active healthy 50 year old woman with an 8-minute month history of intermittent right forefoot bruising and swelling.  She denies any injury but she is quite active.  She points to the second metatarsal dorsally of her foot.  She said the last time this got significant was after she was walking in [place] but was wearing supportive shoes.  Assessment & Plan: Visit Diagnoses:  1. Pain in right foot     Plan: Findings consistent with stress reaction to second metatarsal.  She is going to forego high-impact activities for the next few weeks such as jogging.  She will wear a supportive shoe which she has at home that is stiff.  I have also asked her to begin 5000 international units of vitamin D daily.  Follow-up in 3 weeks.  If she is no better I would recommend an MRI  Follow-Up Instructions: No follow-ups on file.   Ortho Exam  Patient is alert, oriented, no adenopathy, well-dressed, normal affect, normal respiratory effort. Examination of her foot she has no swelling strong palpable dorsalis pedis pulse.  She has good strength.  Focally tender over the dorsal surface of the second metatarsal.  On the plantar surface she does not have any tenderness although she does have callusing over the second MTP joint.  Imaging: XR Foot Complete Right  Result Date: 08/23/2020 Graphs of her right foot were reviewed today.  Well-maintained alignment.  She has a long second metatarsal.  No acute osseous changes are appreciated.  Remote fracture of the great toe and possibly fifth metatarsal shaft  No images are attached to the encounter.  Labs: Lab Results  Component Value Date    ESRSEDRATE 2 04/23/2020   CRP <1 04/23/2020     Lab Results  Component Value Date   ALBUMIN 4.9 (H) 04/23/2020    No results found for: MG No results found for: VD25OH  No results found for: PREALBUMIN CBC EXTENDED Latest Ref Rng & Units 04/23/2020 12/14/2017  WBC 3.4 - 10.8 x10E3/uL 9.8 7.8  RBC 3.77 - 5.28 x10E6/uL 4.54 4.74  HGB 11.1 - 15.9 g/dL 81.1 57.2  HCT 62.0 - 35.5 % 43.0 44.4  PLT 150 - 450 x10E3/uL - 388  NEUTROABS 1.4 - 7.0 x10E3/uL 5.3 4.3  LYMPHSABS 0.7 - 3.1 x10E3/uL 3.6(H) 2.8     There is no height or weight on file to calculate BMI.  Orders:  Orders Placed This Encounter  Procedures   XR Foot Complete Right   No orders of the defined types were placed in this encounter.    Procedures: No procedures performed  Clinical Data: No additional findings.  ROS:  All other systems negative, except as noted in the HPI. Review of Systems  Objective: Vital Signs: There were no vitals taken for this visit.  Specialty Comments:  No specialty comments available.  PMFS History: Patient Active Problem List   Diagnosis Date Noted   Vision changes 04/23/2020   Vertigo 04/23/2020   Bucket-handle tear of medial meniscus of left knee as  current injury 01/24/2019   PVC (premature ventricular contraction) 01/03/2018   Past Medical History:  Diagnosis Date   Anxiety    GERD (gastroesophageal reflux disease)    PONV (postoperative nausea and vomiting)    PVC (premature ventricular contraction)    RVOT, ablated 2019    Family History  Adopted: Yes  Problem Relation Age of Onset   Healthy Sister     Past Surgical History:  Procedure Laterality Date   ANKLE SURGERY     KNEE SURGERY     PVC ABLATION N/A 01/03/2018   Procedure: PVC ABLATION;  Surgeon: Marinus Maw, MD;  [facility];  Service: Cardiovascular;  Laterality: N/A;   ULNAR COLLATERAL LIGAMENT REPAIR Left 11/24/2019   Procedure: LEFT THUMB ULNAR COLLATERAL LIGAMENT REPAIR;   Surgeon: Betha Loa, MD;  Location: [facility];  Service: Orthopedics;  Laterality: Left;   Social History   Occupational History   Occupation: Patent examiner - Psychologist, sport and exercise  Tobacco Use   Smoking status: Never   Smokeless tobacco: Never  Vaping Use   Vaping Use: Never used  Substance and Sexual Activity   Alcohol use: Yes    Comment: occassionally   Drug use: Never   Sexual activity: Not on file

## 2020-08-23 NOTE — Progress Notes (Signed)
xr

## 2020-08-27 ENCOUNTER — Other Ambulatory Visit: Payer: Self-pay

## 2020-08-27 ENCOUNTER — Ambulatory Visit: Payer: Federal, State, Local not specified - PPO | Admitting: Physical Therapy

## 2020-08-27 DIAGNOSIS — R42 Dizziness and giddiness: Secondary | ICD-10-CM

## 2020-08-27 DIAGNOSIS — R2681 Unsteadiness on feet: Secondary | ICD-10-CM

## 2020-08-27 NOTE — Patient Instructions (Addendum)
Feet Apart (Compliant Surface) Head Motion - Eyes Closed    Stand on compliant surface: __pillows______ with feet shoulder width apart. Close eyes and move head slowly, up and down. Repeat __2__ times per session. Do __1__ sessions per day.   Also do with feet together - EO and EC; head turns with targets with EO;  head turns with EC side to side and up/down 5 reps each    Gaze Stabilization: Tip Card  1.Target must remain in focus, not blurry, and appear stationary while head is in motion. 2.Perform exercises with small head movements (45 to either side of midline). 3.Increase speed of head motion so long as target is in focus. 4.If you wear eyeglasses, be sure you can see target through lens (therapist will give specific instructions for bifocal / progressive lenses). 5.These exercises may provoke dizziness or nausea. Work through these symptoms. If too dizzy, slow head movement slightly. Rest between each exercise. 6.Exercises demand concentration; avoid distractions. 7.For safety, perform standing exercises close to a counter, wall, corner, or next to someone.    Gaze Stabilization: Standing Feet Apart    Feet shoulder width apart, keeping eyes on target on wall ____ feet away, tilt head down 15-30 and move head side to side for ____ seconds. Repeat while moving head up and down for ____ seconds. Do ____ sessions per day. Repeat using target on pattern background.  Copyright  VHI. All rights reserved.

## 2020-08-28 NOTE — Therapy (Signed)
[facility] [address] [place], [place], [address] Phone: [phone]   Fax:  [phone]  Physical Therapy Treatment  Patient Details  Name: Rebecca Ellison MRN: [id number] Date of Birth: [birth year] Referring Provider (PT): Newman Pies, MD   Encounter Date: 08/27/2020   PT End of Session - 08/28/20 1035     Visit Number 3    Number of Visits 9    Date for PT Re-Evaluation 10/12/20    Authorization Type BCBS; $30 copay.  VL: 43 remaining out of 50    Authorization - Visit Number 3    Authorization - Number of Visits 43    PT Start Time 1535    PT Stop Time 1630    PT Time Calculation (min) 55 min    Activity Tolerance Patient tolerated treatment well    Behavior During Therapy WFL for tasks assessed/performed             Past Medical History:  Diagnosis Date   Anxiety    GERD (gastroesophageal reflux disease)    PONV (postoperative nausea and vomiting)    PVC (premature ventricular contraction)    RVOT, ablated 2019    Past Surgical History:  Procedure Laterality Date   ANKLE SURGERY     KNEE SURGERY     PVC ABLATION N/A 01/03/2018   Procedure: PVC ABLATION;  Surgeon: Marinus Maw, MD;  [facility];  Service: Cardiovascular;  Laterality: N/A;   ULNAR COLLATERAL LIGAMENT REPAIR Left 11/24/2019   Procedure: LEFT THUMB ULNAR COLLATERAL LIGAMENT REPAIR;  Surgeon: Betha Loa, MD;  Location: [facility];  Service: Orthopedics;  Laterality: Left;    There were no vitals filed for this visit.   Subjective Assessment - 08/27/20 1540     Subjective Pt has not had any dizziness this week; states she is now on medication for the migraines and this seems to be helping significantly    Pertinent History anxiety, GERD, PVC, ankle surgery, knee surgery, PVC ablation, L ulnar collateral ligament repair, vestibular migraines    Diagnostic tests vestibular testing    Patient  Stated Goals To be able to get back to working out - weight lifting, cardio, crossfit trainer    Currently in Pain? No/denies                                Vestibular Treatment/Exercise - 08/28/20 0001       Vestibular Treatment/Exercise   Vestibular Treatment Provided Gaze    Gaze Exercises X1 Viewing Horizontal;X1 Viewing Vertical      X1 Viewing Horizontal   Foot Position bil. stance - standing    Time --   60 secs   Reps 1    Comments plain background; pt reported slight movement of target after performing ex. for 50 secs - but only lasted for approx. 3  secs with pt able to continue exercise and complete for 60 secs      X1 Viewing Vertical   Foot Position bil. stance - standing    Time --   60 secs   Reps 1    Comments plain background; pt reported no symptoms or problems with x1 viewing vertical exercise; pt stated vertical seemed easier than horizontal x1 viewing                Balance Exercises - 08/28/20 0001  Balance Exercises: Standing   Standing Eyes Opened Narrow base of support (BOS);Wide (BOA);Head turns;Foam/compliant surface;5 reps   horizontal and vertical head turns   Standing Eyes Closed Narrow base of support (BOS);Wide (BOA);Head turns;Foam/compliant surface;5 reps   horizontal and vertical head turns              PT Education - 08/28/20 1028     Education Details Pt issued HEP - x1 viewing in standing - horizontal and vertical head turns - plain background initially but pt was instructed to progress to patterned background; also instructed in balance on foam - EO and EC - with horizontal and vertical head turns.  Pt was given handout on Vestibular Migraines and handout on Hydrops & Diet Recommendations (both from [facility]);  also discussed extensively need for her to be able to exercise for stress management - discussed benefits of aquatic exercise due to multiple joint injuries, including possible stress fracture in Rt  foot which was just diagnosed last week; explained test findings from office visit with Dr. Suszanne Conners - discussed migraine vs. vestibular hypofunction Rt side as pt only has 27% hypofunction per Dr.Teoh's report ((<25% is WNL's per his note).    Person(s) Educated Patient    Methods Explanation;Demonstration;Handout    Comprehension Verbalized understanding;Returned demonstration                 PT Long Term Goals - 08/28/20 1325       PT LONG TERM GOAL #1   Title Pt will increase DFS to >/= 58    Baseline DFS: 54; DPS 69 (WFL)    Time 8    Period Weeks    Status New      PT LONG TERM GOAL #2   Title Pt will demonstrate independence with final vestibular HEP and will report ability to return to exercise and weight lifting    Time 8    Period Weeks    Status New      PT LONG TERM GOAL #3   Title Pt will report 0/5 dizziness on MSQ    Baseline 0/5 for all movements, goal achieved    Status Achieved      PT LONG TERM GOAL #4   Title Pt will report 25% reduction in episodes of dizziness due to ID of migraine triggers and lifestyle modification    Baseline ~1x/week    Time 8    Period Weeks    Status New                   Plan - 08/27/20 1542     Clinical Impression Statement Pt reported no dizziness in today's session; pt able to perform x1 viewing horizontal for 50 secs prior to onset of symptom of mild "movement" of target, which lasted only approx. 3 secs, with pt able to continue exercise for the complete 60 secs duration.  Pt performed x1 viewing vertical with no c/o any symptoms.  Discussed at length pt's history of migraines vs. Rt vestibular hypofunction.  Pt brought visit summary from Dr. 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 Pt able to maintain balance on foam with EO and EC without difficulty.  Symptom    Personal Factors and Comorbidities Comorbidity 3+;Past/Current  Experience;Profession    Comorbidities anxiety, GERD, PVC, ankle surgery, knee surgery, PVC ablation, L ulnar collateral ligament repair, vestibular migraines    Examination-Activity Limitations Other  exercise   Examination-Participation Restrictions Community Activity;Driving;Occupation;Personal Finances    Stability/Clinical Decision Making Stable/Uncomplicated    Rehab Potential Good    PT Frequency 1x / week    PT Duration 8 weeks    PT Treatment/Interventions ADLs/Self Care Home Management;Canalith Repostioning;Functional mobility training;Therapeutic activities;Therapeutic exercise;Neuromuscular re-education;Balance training;Patient/family education;Vestibular    PT Next Visit Plan Initiate HEP for habituation focusing on balance on compliant surfaces and vision removed; return to weight lifting and exercise routine.  Stress management techniques: breathing, body scans, guided imagery    Consulted and Agree with Plan of Care Patient             Patient will benefit from skilled therapeutic intervention in order to improve the following deficits and impairments:  Dizziness, Decreased balance  Visit Diagnosis: Dizziness and giddiness  Unsteadiness on feet     Problem List Patient Active Problem List   Diagnosis Date Noted   Vision changes 04/23/2020   Vertigo 04/23/2020   Bucket-handle tear of medial meniscus of left knee as current injury 01/24/2019   PVC (premature ventricular contraction) 01/03/2018    Acey Woodfield, Donavan Burnet, PT 08/28/2020, 1:27 PM  [facility] [address] [place], [place], [address] Phone: [phone]   Fax:  [phone]  Name: Rebecca Ellison [facility] MRN: [id number] Date of Birth: April 03, 1970

## 2020-09-10 ENCOUNTER — Other Ambulatory Visit: Payer: Self-pay

## 2020-09-10 ENCOUNTER — Ambulatory Visit: Payer: Federal, State, Local not specified - PPO | Admitting: Physical Therapy

## 2020-09-10 DIAGNOSIS — R42 Dizziness and giddiness: Secondary | ICD-10-CM

## 2020-09-10 DIAGNOSIS — R2681 Unsteadiness on feet: Secondary | ICD-10-CM

## 2020-09-10 DIAGNOSIS — Z124 Encounter for screening for malignant neoplasm of cervix: Secondary | ICD-10-CM | POA: Diagnosis not present

## 2020-09-10 NOTE — Therapy (Signed)
[facility] [address] [place], [place], [address] Phone: [phone]   Fax:  [phone]  Physical Therapy Treatment and D/C Summary  Patient Details  Name: Rebecca Ellison MRN: [id number] Date of Birth: [birth year] Referring Provider (PT): Leta Baptist, MD   Encounter Date: 09/10/2020   PT End of Session - 09/10/20 1328     Visit Number 4    Number of Visits 9    Date for PT Re-Evaluation 10/12/20    Authorization Type BC[employer]; $30 copay.  VL: 43 remaining out of 50    Authorization - Visit Number 4    Authorization - Number of Visits 65    PT Start Time 1326    PT Stop Time 1352    PT Time Calculation (min) 26 min    Activity Tolerance Patient tolerated treatment well    Behavior During Therapy WFL for tasks assessed/performed             Past Medical History:  Diagnosis Date   Anxiety    GERD (gastroesophageal reflux disease)    PONV (postoperative nausea and vomiting)    PVC (premature ventricular contraction)    RVOT, ablated 2019    Past Surgical History:  Procedure Laterality Date   ANKLE SURGERY     KNEE SURGERY     PVC ABLATION N/A 01/03/2018   Procedure: PVC ABLATION;  Surgeon: Evans Lance, MD;  Location: [facility];  Service: Cardiovascular;  Laterality: N/A;   ULNAR COLLATERAL LIGAMENT REPAIR Left 11/24/2019   Procedure: LEFT THUMB ULNAR COLLATERAL LIGAMENT REPAIR;  Surgeon: Leanora Cover, MD;  Location: [facility];  Service: Orthopedics;  Laterality: Left;    There were no vitals filed for this visit.   Subjective Assessment - 09/10/20 1329     Subjective Pt had one episode of dizziness when her dog bled on white carpet; pt does feel like stress is her main migraine and dizziness trigger.  Meclizine does help symptoms.  Has not been exercising due to she tends to overdo it.    Pertinent History anxiety, GERD, PVC, ankle surgery, knee surgery, PVC ablation, L ulnar  collateral ligament repair, vestibular migraines    Diagnostic tests vestibular testing    Patient Stated Goals To be able to get back to working out - weight lifting, cardio, crossfit trainer    Currently in Pain? No/denies             Continued discussion with patient regarding symptoms and triggers.  No specific movement or activity triggers symptoms and pt report no symptoms during therapy or when performing HEP.  Pt believes stress is main trigger.  Pt is limited in her ability to use exercise for stress relief due to stress fracture in foot but does walk for stress relief and uses breathing.  Pt will continue to discuss other options for management with physician.    Discussed D/C from therapy at this time due to lack of benefit.  Pt agreeable.       PT Education - 09/10/20 1353     Education Details Based on symptom report, triggers and lack of ability to reproduce symptoms in therapy, no further PT needed at this time.  Pt agreeable to D/C today.    Person(s) Educated Patient    Methods Explanation    Comprehension Verbalized understanding               PT Long Term Goals - 09/10/20 1354  PT LONG TERM GOAL #1   Title Pt will increase DFS to >/= 58    Baseline DFS: 54; DPS 69 (WFL)    Time 8    Period Weeks    Status Deferred      PT LONG TERM GOAL #2   Title Pt will demonstrate independence with final vestibular HEP and will report ability to return to exercise and weight lifting    Baseline limited ability to exercise due to foot stress fracture; vestibular HEP does not reproduce symptoms    Time 8    Period Weeks    Status Deferred      PT LONG TERM GOAL #3   Title Pt will report 0/5 dizziness on MSQ    Baseline 0/5 for all movements, goal achieved    Status Achieved      PT LONG TERM GOAL #4   Title Pt will report 25% reduction in episodes of dizziness due to ID of migraine triggers and lifestyle modification    Baseline ~1x/week    Time 8     Period Weeks    Status Achieved                   Plan - 09/10/20 1355     Clinical Impression Statement Pt continues to report no symptoms of dizziness with work activities, daily activities, and unable to reproduce symptoms with HEP or during therapy sessions.  Pt has identified trigger as increased life stressors and plans to continue to coordinate with physician for medical management and use of walking for stress relief - not able to do extensive exercise right now due to stress fracture in foot.  Pt does not appear to be experiencing any symptoms of hypofunction at this time.  Pt does not appear to benefit from vestibular rehab at this time.  Pt agreeable to D/C.    Personal Factors and Comorbidities Comorbidity 3+;Past/Current Experience;Profession    Comorbidities anxiety, GERD, PVC, ankle surgery, knee surgery, PVC ablation, L ulnar collateral ligament repair, vestibular migraines    Examination-Activity Limitations Other   exercise   Examination-Participation Restrictions Community Activity;Driving;Occupation;Personal Finances    PT Treatment/Interventions ADLs/Self Care Home Management;Canalith Repostioning;Functional mobility training;Therapeutic activities;Therapeutic exercise;Neuromuscular re-education;Balance training;Patient/family education;Vestibular    PT Next Visit Plan D/C    Consulted and Agree with Plan of Care Patient             Patient will benefit from skilled therapeutic intervention in order to improve the following deficits and impairments:  Dizziness, Decreased balance  Visit Diagnosis: Dizziness and giddiness  Unsteadiness on feet     Problem List Patient Active Problem List   Diagnosis Date Noted   Vision changes 04/23/2020   Vertigo 04/23/2020   Bucket-handle tear of medial meniscus of left knee as current injury 01/24/2019   PVC (premature ventricular contraction) 01/03/2018   PHYSICAL THERAPY DISCHARGE SUMMARY  Visits from Start  of Care: 4  Current functional level related to goals / functional outcomes: See impression statement and LTG achievement above   Remaining deficits: Vestibular migraines/dizziness triggered by life stressors   Education / Equipment: Handout on vestibular migraine   Patient agrees to discharge. Patient goals were met and other goals deferred. Patient is being discharged due to maximized rehab potential.   Rebecca Ellison, PT, DPT 09/10/20    2:03 PM    [facility] [address] [place], [place], [address] Phone: [phone]   Fax:  [phone]  Name: Rebecca Ellison MRN: [id number] Date of Birth: Feb 23, 1970

## 2020-09-12 ENCOUNTER — Encounter: Payer: Self-pay | Admitting: Orthopedic Surgery

## 2020-09-12 ENCOUNTER — Ambulatory Visit: Payer: Federal, State, Local not specified - PPO | Admitting: Orthopedic Surgery

## 2020-09-12 ENCOUNTER — Other Ambulatory Visit: Payer: Self-pay

## 2020-09-12 DIAGNOSIS — Z3042 Encounter for surveillance of injectable contraceptive: Secondary | ICD-10-CM | POA: Diagnosis not present

## 2020-09-12 DIAGNOSIS — M79671 Pain in right foot: Secondary | ICD-10-CM | POA: Diagnosis not present

## 2020-09-12 DIAGNOSIS — M6701 Short Achilles tendon (acquired), right ankle: Secondary | ICD-10-CM | POA: Diagnosis not present

## 2020-09-12 DIAGNOSIS — M84374A Stress fracture, right foot, initial encounter for fracture: Secondary | ICD-10-CM | POA: Diagnosis not present

## 2020-09-12 NOTE — Progress Notes (Signed)
Office Visit Note   Patient: Rebecca Ellison           Date of Birth: 1970/04/16           MRN: [id number] Visit Date: 09/12/2020              Requested by: Jarrett Soho, PA-C [address] [place],  [place] [address] PCP: Jarrett Soho, PA-C  Chief Complaint  Patient presents with   Right Foot - Pain, Follow-up      HPI: Patient is a 50 year old woman who is seen for initial evaluation by myself for forefoot pain worse beneath the second metatarsal head of the right foot.  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  Assessment & Plan: Visit Diagnoses:  1. Pain in right foot   2. Stress fracture of right foot, initial encounter   3. Achilles tendon contracture, right     Plan: We will obtain an MRI scan to evaluate for possible stress fracture.  I feel that patient needs offloading of the metatarsal heads and with clawing of the second toe her best option may be to proceed with a Weil osteotomy of the second third and fourth metatarsals and a gastrocnemius recession.  Will evaluate after the MRI scan.  Follow-Up Instructions: No follow-ups on file.   Ortho Exam  Patient is alert, oriented, no adenopathy, well-dressed, normal affect, normal respiratory effort. Examination patient has a good dorsalis pedis and posterior tibial pulse she has an extensive incision over the fibula from previous ankle reconstruction surgery this is most likely the cause of her Achilles contracture with dorsiflexion only to neutral with her knee extended.  She has callus beneath the second and fourth metatarsal heads with a prominent metatarsal flexible clawing of the second toe and tenderness to palpation at the MTP  joint of the second third and fourth toes.  Her radiograph shows a long second third and fourth metatarsal with some widening of the cortical bone of the second metatarsal consistent with increased stress from the long second metatarsal.  Imaging: No results found. No images are attached to the encounter.  Labs: Lab Results  Component Value Date   ESRSEDRATE 2 04/23/2020   CRP <1 04/23/2020     Lab Results  Component Value Date   ALBUMIN 4.9 (H) 04/23/2020    No results found for: MG No results found for: VD25OH  No results found for: PREALBUMIN CBC EXTENDED Latest Ref Rng & Units 04/23/2020 12/14/2017  WBC 3.4 - 10.8 x10E3/uL 9.8 7.8  RBC 3.77 - 5.28 x10E6/uL 4.54 4.74  HGB 11.1 - 15.9 g/dL 35.5 73.2  HCT 20.2 - 54.2 % 43.0 44.4  PLT 150 - 450 x10E3/uL - 388  NEUTROABS 1.4 - 7.0 x10E3/uL 5.3 4.3  LYMPHSABS 0.7 - 3.1 x10E3/uL 3.6(H) 2.8     There is no height or weight on file to calculate BMI.  Orders:  No orders of the defined types were placed in this encounter.  No orders of the defined types were placed in this encounter.    Procedures: No procedures performed  Clinical Data: No additional findings.  ROS:  All other systems negative, except as noted in the HPI. Review of  Systems  Objective: Vital Signs: There were no vitals taken for this visit.  Specialty Comments:  No specialty comments available.  PMFS History: Patient Active Problem List   Diagnosis Date Noted   Vision changes 04/23/2020   Vertigo 04/23/2020   Bucket-handle tear of medial meniscus of left knee as current injury 01/24/2019   PVC (premature ventricular contraction) 01/03/2018   Past Medical History:  Diagnosis Date   Anxiety    GERD (gastroesophageal reflux disease)    PONV (postoperative nausea and vomiting)    PVC (premature ventricular contraction)    RVOT, ablated 2019    Family History  Adopted: Yes  Problem Relation Age of Onset   Healthy Sister     Past  Surgical History:  Procedure Laterality Date   ANKLE SURGERY     KNEE SURGERY     PVC ABLATION N/A 01/03/2018   Procedure: PVC ABLATION;  Surgeon: Marinus Maw, MD;  [facility]: [facility];  Service: Cardiovascular;  Laterality: N/A;   ULNAR COLLATERAL LIGAMENT REPAIR Left 11/24/2019   Procedure: LEFT THUMB ULNAR COLLATERAL LIGAMENT REPAIR;  Surgeon: Betha Loa, MD;  Location: [facility];  Service: Orthopedics;  Laterality: Left;   Social History   Occupational History   Occupation: Patent examiner - Psychologist, sport and exercise  Tobacco Use   Smoking status: Never   Smokeless tobacco: Never  Vaping Use   Vaping Use: Never used  Substance and Sexual Activity   Alcohol use: Yes    Comment: occassionally   Drug use: Never   Sexual activity: Not on file

## 2020-09-16 ENCOUNTER — Encounter: Payer: Federal, State, Local not specified - PPO | Admitting: Physical Therapy

## 2020-09-17 ENCOUNTER — Ambulatory Visit: Payer: Federal, State, Local not specified - PPO | Admitting: Physician Assistant

## 2020-09-22 ENCOUNTER — Ambulatory Visit
Admission: RE | Admit: 2020-09-22 | Discharge: 2020-09-22 | Disposition: A | Discharge status: Home / Self Care | Payer: Federal, State, Local not specified - PPO | Source: Ambulatory Visit | Attending: Orthopedic Surgery | Admitting: Orthopedic Surgery

## 2020-09-22 DIAGNOSIS — M79671 Pain in right foot: Secondary | ICD-10-CM

## 2020-09-22 DIAGNOSIS — M6701 Short Achilles tendon (acquired), right ankle: Secondary | ICD-10-CM

## 2020-09-22 DIAGNOSIS — M2011 Hallux valgus (acquired), right foot: Secondary | ICD-10-CM | POA: Diagnosis not present

## 2020-09-22 DIAGNOSIS — M84374A Stress fracture, right foot, initial encounter for fracture: Secondary | ICD-10-CM

## 2020-09-23 ENCOUNTER — Encounter: Payer: Federal, State, Local not specified - PPO | Admitting: Physical Therapy

## 2020-09-26 ENCOUNTER — Ambulatory Visit: Payer: Federal, State, Local not specified - PPO | Admitting: Orthopedic Surgery

## 2020-09-26 DIAGNOSIS — M6701 Short Achilles tendon (acquired), right ankle: Secondary | ICD-10-CM

## 2020-09-26 DIAGNOSIS — M7741 Metatarsalgia, right foot: Secondary | ICD-10-CM

## 2020-09-30 ENCOUNTER — Encounter: Payer: Federal, State, Local not specified - PPO | Admitting: Physical Therapy

## 2020-10-09 ENCOUNTER — Encounter: Payer: Self-pay | Admitting: Orthopedic Surgery

## 2020-10-09 NOTE — Progress Notes (Signed)
Office Visit Note   Patient: Rebecca Ellison           Date of Birth: Dec 21, 1970           MRN: [id number] Visit Date: 09/26/2020              Requested by: Jarrett Soho, PA-C [address] [place],  [place] [address] PCP: Jarrett Soho, PA-C  Chief Complaint  Patient presents with   Right Foot - Follow-up    MRI review       HPI: Patient is a 50 year old woman who presents in follow-up status post MRI scan of her right foot.  Patient has had pain in the ankle pain around the metatarsal heads.  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  Assessment & Plan: Visit Diagnoses:  1. Metatarsalgia, right foot   2. Achilles tendon contracture, right     Plan: Recommended Achilles stretching with stiff soled sneaker or a carbon plate under her orthotics.  Discussed that if she does not progress well with conservative treatment a Weil osteotomy of the second metatarsal and possible gastrocnemius recession is an option.  Follow-Up Instructions: Return if symptoms worsen or fail to improve.   Ortho Exam  Patient is alert, oriented, no adenopathy, well-dressed, normal affect, normal respiratory effort. Examination patient has a good dorsalis pedis pulse she is tender to palpation around the ankle today there is no redness no cellulitis she is tender to palpation beneath the second metatarsal head there is callus beneath the second metatarsal head and clawing of the second toe.  Review of the MRI scan shows some soft tissue thickening around the second metatarsal head no stress fracture.  Imaging: No results found. No images are attached to the encounter.  Labs: Lab Results  Component Value Date   ESRSEDRATE 2 04/23/2020   CRP <1 04/23/2020     Lab Results  Component Value Date   ALBUMIN 4.9 (H) 04/23/2020    No results found for: MG No results found for: VD25OH  No results found for: PREALBUMIN CBC EXTENDED Latest Ref Rng &  Units 04/23/2020 12/14/2017  WBC 3.4 - 10.8 x10E3/uL 9.8 7.8  RBC 3.77 - 5.28 x10E6/uL 4.54 4.74  HGB 11.1 - 15.9 g/dL 64.3 32.9  HCT 51.8 - 84.1 % 43.0 44.4  PLT 150 - 450 x10E3/uL - 388  NEUTROABS 1.4 - 7.0 x10E3/uL 5.3 4.3  LYMPHSABS 0.7 - 3.1 x10E3/uL 3.6(H) 2.8     There is no height or weight on file to calculate BMI.  Orders:  No orders of the defined types were placed in this encounter.  No orders of the defined types were placed in this encounter.    Procedures: No procedures performed  Clinical Data: No additional findings.  ROS:  All other systems negative, except as noted in the HPI. Review of Systems  Objective: Vital Signs: There were no vitals taken for this visit.  Specialty Comments:  No specialty comments available.  PMFS History: Patient Active Problem List   Diagnosis Date Noted   Vision changes 04/23/2020   Vertigo 04/23/2020   Bucket-handle tear of medial meniscus of left knee as current injury 01/24/2019   PVC (premature ventricular contraction) 01/03/2018   Past Medical History:  Diagnosis Date   Anxiety    GERD (gastroesophageal reflux disease)    PONV (postoperative nausea and vomiting)    PVC (premature ventricular contraction)    RVOT, ablated 2019  Family History  Adopted: Yes  Problem Relation Age of Onset   Healthy Sister     Past Surgical History:  Procedure Laterality Date   ANKLE SURGERY     KNEE SURGERY     PVC ABLATION N/A 01/03/2018   Procedure: PVC ABLATION;  Surgeon: Marinus Maw, MD;  [facility];  Service: Cardiovascular;  Laterality: N/A;   ULNAR COLLATERAL LIGAMENT REPAIR Left 11/24/2019   Procedure: LEFT THUMB ULNAR COLLATERAL LIGAMENT REPAIR;  Surgeon: Betha Loa, MD;  Location: [facility];  Service: Orthopedics;  Laterality: Left;   Social History   Occupational History   Occupation: Patent examiner - Psychologist, sport and exercise  Tobacco Use   Smoking status: Never   Smokeless  tobacco: Never  Vaping Use   Vaping Use: Never used  Substance and Sexual Activity   Alcohol use: Yes    Comment: occassionally   Drug use: Never   Sexual activity: Not on file

## 2020-10-17 ENCOUNTER — Ambulatory Visit: Payer: Federal, State, Local not specified - PPO | Admitting: Neurology

## 2020-10-17 ENCOUNTER — Encounter: Payer: Self-pay | Admitting: Neurology

## 2020-10-17 ENCOUNTER — Other Ambulatory Visit: Payer: Self-pay

## 2020-10-17 VITALS — BP 132/91 | HR 100 | Ht 67.0 in | Wt 148.0 lb

## 2020-10-17 DIAGNOSIS — G43109 Migraine with aura, not intractable, without status migrainosus: Secondary | ICD-10-CM | POA: Diagnosis not present

## 2020-10-17 DIAGNOSIS — R42 Dizziness and giddiness: Secondary | ICD-10-CM

## 2020-10-17 MED ORDER — LAMOTRIGINE 100 MG PO TABS: 100.0000 mg | ORAL_TABLET | Freq: Every day | ORAL | 4 refills | Status: DC

## 2020-10-17 MED ORDER — LAMOTRIGINE 100 MG PO TABS: 100.0000 mg | ORAL_TABLET | Freq: Two times a day (BID) | ORAL | 4 refills | Status: DC

## 2020-10-17 MED ORDER — RIZATRIPTAN BENZOATE 10 MG PO TBDP: 10.0000 mg | ORAL_TABLET | ORAL | 3 refills | Status: AC | PRN

## 2020-10-17 MED ORDER — VENLAFAXINE HCL ER 37.5 MG PO CP24: 37.5000 mg | ORAL_CAPSULE | Freq: Every day | ORAL | 3 refills | Status: AC

## 2020-10-17 NOTE — Progress Notes (Signed)
Chief Complaint  Patient presents with   Follow-up    New room - alone. She is relocating to [place] in three weeks. Reports having more frequent episodes of vertigo/ear ringing/migraines. She has continued taking lamotrigine 25mg , three tablets in the morning. Rizatriptan helps to relieve her symptoms.      ASSESSMENT AND PLAN  Rebecca Ellison is a 50 y.o. female  Intermittent episode of vertigo, Anxiety Likely migraine variants, Responding well to Maxalt 10 mg as needed Keep lamotrigine 100 mg every night, add on Effexor XR 37.5 mg every morning as preventive medication    DIAGNOSTIC DATA (LABS, IMAGING, TESTING) - I reviewed patient records, labs, notes, testing and imaging myself where available.   MEDICAL HISTORY: Rebecca Ellison is a 50 year old female, seen in request by her primary care PA 55 for evaluation of episodes of sudden onset eye twitching, visual changes, vertigo, initial evaluation was on April 23, 2020  I reviewed and summarized the referring note.PMHX. Right ankle injury required surgery in October 2016, done [place] [facility] [place], Right shoulder surgery, right ulnar collateral ligament repair Right thumb surgery Cardiac ablation by cardiologist Dr. 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  She has been exercise of her life, did have recurrent similar episode since age 38s, 5-6 major episode in 1 year, most recent one was on April 13, 2020, in the afternoon, she finished her routine workout, feeling great, around 9 PM, when she was trying to get ready for bed, she had sudden onset bilateral ear louder ringing sound, dizziness, vertigo, nausea, see flashing light in bilateral visual field, vision went black, she fell to her knees, but no loss of consciousness, she has trouble seeing things, has nausea, vomiting, she was able to call her neighbor by verbal command, her neighbor was able to  help her get into the bed, she denied lateralized motor or sensory sensory, went to sleep after 30 minutes, wake up next morning moderate dull achy pain at the left parietal region, skin sensitive, like it has been bruised.  This is her most typical episode, the other episode are very similar, but often milder degree.  UPDATE Sept 1 2022: She has tried Maxalt 5 mg as needed at the beginning of ear ringing, before the onset of vertigo, which has truly helped her, lamotrigine 25 mg 3 tablets every night has helped the frequency of the episode,  Previously tried Topamax complains of side effect of brain fog, paresthesia, EEG was normal in March 2022,  She was seen by cardiologist, treadmill stress test was normal  Was also seen by ENT, had extensive vestibular testing, I did not see report, patient stated mild decreased right side function  PHYSICAL EXAM:   Vitals:   10/17/20 1005  BP: (!) 132/91  Pulse: 100  Weight: 148 lb (67.1 kg)  Height: 5\' 7"  (1.702 m)   Not recorded     Body mass index is 23.18 kg/m.  PHYSICAL EXAMNIATION:  Gen: NAD, conversant, well nourised, well groomed            NEUROLOGICAL EXAM:  MENTAL STATUS: Depressed looking middle-aged female Speech/cognition: Awake, alert, oriented to history taking and casual conversation      CRANIAL NERVES: CN II: Visual fields are full to confrontation. Pupils are round equal and briskly reactive to light. CN III, IV, VI: extraocular movement are normal. No ptosis. CN V: Facial sensation is intact to light touch CN VII: Face  is symmetric with normal eye closure  CN VIII: Hearing is normal to causal conversation. CN IX, X: Phonation is normal. CN XI: Head turning and shoulder shrug are intact  MOTOR: There is no pronator drift of out-stretched arms. Muscle bulk and tone are normal. Muscle strength is normal.  REFLEXES: Reflexes are 2+ and symmetric at the biceps, triceps, knees, and ankles. Plantar responses are  flexor.  SENSORY: Intact to light touch, pinprick and vibratory sensation are intact in fingers and toes.  COORDINATION: There is no trunk or limb dysmetria noted.  GAIT/STANCE: Posture is normal.   REVIEW OF SYSTEMS:  Full 14 system review of systems performed and notable only for as above All other review of systems were negative.   ALLERGIES: Allergies  Allergen Reactions   Sulfa Antibiotics Hives    HOME MEDICATIONS: Current Outpatient Medications  Medication Sig Dispense Refill   lamoTRIgine (LAMICTAL) 25 MG tablet Take 3 tablets at bedtime 90 tablet 6   medroxyPROGESTERone (DEPO-PROVERA) 150 MG/ML injection Inject 150 mg into the muscle every 3 (three) months.  4   ondansetron (ZOFRAN ODT) 4 MG disintegrating tablet Take 1 tablet (4 mg total) by mouth every 8 (eight) hours as needed. 20 tablet 6   rizatriptan (MAXALT-MLT) 5 MG disintegrating tablet Take 1 tablet (5 mg total) by mouth as needed. May repeat in 2 hours if needed 12 tablet 11   No current facility-administered medications for this visit.    PAST MEDICAL HISTORY: Past Medical History:  Diagnosis Date   Anxiety    GERD (gastroesophageal reflux disease)    PONV (postoperative nausea and vomiting)    PVC (premature ventricular contraction)    RVOT, ablated 2019    PAST SURGICAL HISTORY: Past Surgical History:  Procedure Laterality Date   ANKLE SURGERY     KNEE SURGERY     PVC ABLATION N/A 01/03/2018   Procedure: PVC ABLATION;  Surgeon: Marinus Maw, MD;  [facility]: [facility];  Service: Cardiovascular;  Laterality: N/A;   ULNAR COLLATERAL LIGAMENT REPAIR Left 11/24/2019   Procedure: LEFT THUMB ULNAR COLLATERAL LIGAMENT REPAIR;  Surgeon: Betha Loa, MD;  Location: [facility];  Service: Orthopedics;  Laterality: Left;    FAMILY HISTORY: Family History  Adopted: Yes  Problem Relation Age of Onset   Healthy Sister     SOCIAL HISTORY: Social History   Socioeconomic  History   Marital status: Single    Spouse name: Not on file   Number of children: 0   Years of education: college   Highest education level: Not on file  Occupational History   Occupation: Patent examiner - special agent  Tobacco Use   Smoking status: Never   Smokeless tobacco: Never  Vaping Use   Vaping Use: Never used  Substance and Sexual Activity   Alcohol use: Yes    Comment: occassionally   Drug use: Never   Sexual activity: Not on file  Other Topics Concern   Not on file  Social History Narrative   Lives alone.   Right-handed.   10-14 ounces of caffeine per day.   Social Determinants of Health   Financial Resource Strain: Not on file  Food Insecurity: Not on file  Transportation Needs: Not on file  Physical Activity: Not on file  Stress: Not on file  Social Connections: Not on file  Intimate Partner Violence: Not on file      Levert Feinstein, M.D. Ph.D.  [facility] [address], [address] [place], [place] [address] Ph: [phone] Fax: [phone]  CC:  Jarrett Soho, PA-C [address] [place],  [place] [address]  Jarrett Soho, PA-C

## 2020-10-22 ENCOUNTER — Telehealth: Payer: Self-pay | Admitting: Neurology

## 2020-10-22 NOTE — Telephone Encounter (Signed)
Confirmed w/ Dr. Terrace Arabia that patient should be taking the following:  1) lamotrigine 100mg , one tab at bedtime 2) venlafaxine XR 37.5mg , one tab in am  The patient had previously mentioned having sleeping issues when taking lamotrigine at bedtime. Per vo by Dr. , okay to take the lamotrigine in am and venlafaxine in pm.   Left message for patient to return my call.

## 2020-10-22 NOTE — Telephone Encounter (Signed)
Pt called states she has some questions about the directions of her venlafaxine XR (EFFEXOR XR) 37.5 MG 24 hr capsule and lamoTRIgine (LAMICTAL) 100 MG tablet. Pt requesting a call back.

## 2020-10-22 NOTE — Telephone Encounter (Signed)
I spoke to the patient and she verbalized understanding of the plan below.  I also called [facility] and spoke to Ron. Clarified her current prescriptions from [place]. Voided old refills on file to avoid confusion.

## 2020-12-16 ENCOUNTER — Other Ambulatory Visit: Payer: Self-pay | Admitting: Orthopaedic Surgery

## 2020-12-16 DIAGNOSIS — S62624D Displaced fracture of medial phalanx of right ring finger, subsequent encounter for fracture with routine healing: Secondary | ICD-10-CM

## 2020-12-17 ENCOUNTER — Other Ambulatory Visit: Payer: Self-pay

## 2020-12-17 ENCOUNTER — Ambulatory Visit
Admission: RE | Admit: 2020-12-17 | Discharge: 2020-12-17 | Disposition: A | Discharge status: Home / Self Care | Payer: BLUE CROSS/BLUE SHIELD | Source: Ambulatory Visit | Attending: Orthopaedic Surgery | Admitting: Orthopaedic Surgery

## 2020-12-17 DIAGNOSIS — S62624D Displaced fracture of medial phalanx of right ring finger, subsequent encounter for fracture with routine healing: Secondary | ICD-10-CM | POA: Insufficient documentation

## 2020-12-19 ENCOUNTER — Other Ambulatory Visit: Payer: Self-pay

## 2020-12-19 ENCOUNTER — Ambulatory Visit (INDEPENDENT_AMBULATORY_CARE_PROVIDER_SITE_OTHER): Payer: BLUE CROSS/BLUE SHIELD

## 2020-12-19 ENCOUNTER — Encounter (INDEPENDENT_AMBULATORY_CARE_PROVIDER_SITE_OTHER): Payer: Self-pay

## 2020-12-19 NOTE — Pre-Procedure Instructions (Signed)
Important Instructions Before Your Procedure        Your case is currently scheduled for 12/20/2020 at 1140 with Pan, Franchot Mimes, MD.      The date and/or time of your surgery may change.  Your surgeon's office will notify you, up until the business day before surgery, if there is any change to your surgery date or time.  Please don't hesitate to call your surgeon's office directly with any questions.        IMPORTANT You must visit the Preparing for Your Procedure guide link below for additional instructions including fasting guidelines and directions before your procedure. If you received fasting or skin preparation instructions from your surgeon or pre-procedural provider, please follow those specific instructions. The instructions here are general instructions that do not pertain to all patients.  [email or web address]    If the link doesn't open, please copy and paste in your browser    QUESTIONS?  If you have any questions about your pre-procedural evaluation, please call the clinic location where your appointment was performed:     [facility]: [phone]  [facility]: [phone]  [facility]: [phone]  [facility]: [phone]  [facility]: [phone]

## 2020-12-19 NOTE — PSS Phone Screening (Signed)
Pre-Anesthesia Evaluation    Pre-op phone visit requested by:   Reason for pre-op phone visit: Patient anticipating RIGHT RING FINGER MIDDLE PHALANX FRACTURE CLOSED VERSUS OPEN REDUCTION AND PINNING, POSSIBLE INTERNAL FIXATION procedure.    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 Worked up by cardiologist, determined not to be cardiac related.     Vestibular migraine     was causing syncope, does not see neuro anymore     Past Surgical History:   Procedure Laterality Date    CARDIAC ABLATION  2019    HAND SURGERY Left 2021    thumb    KNEE ARTHROSCOPY Right 2020    with menicus repair    REPAIR, LOWER EXTREMITY TENDON Right 11/22/2014    Procedure: REPAIR, LOWER EXTREMITY PERONEAL TENDON ;  Surgeon: Kandice Robinsons, DPM;  Location: [facility];  Service: Podiatry;  Laterality: Right;    SHOULDER ARTHROSCOPY  2018    elbow surgery done at the same time    STABILIZATION, ANKLE, BROSTROM PROCEDURE Right 11/22/2014    Procedure: LATERAL ANKLE STABILIZATION; PERONEAL TENDON REPAIR;  Surgeon: Kandice Robinsons, DPM;  Location: [facility];  Service: Podiatry;  Laterality: Right;       Current  Outpatient Medications:     lamoTRIgine (LaMICtal) 100 MG tablet, , Disp: , Rfl:     medroxyPROGESTERone (DEPO-PROVERA) 150 MG/ML injection, Inject 150 mg into the muscle every 3 (three) months, Disp: , Rfl:     venlafaxine (EFFEXOR-XR) 37.5 MG 24 hr capsule, , Disp: , Rfl:      Medication List            Accurate as of December 19, 2020  8:14 AM. Always use your most recent med list.                lamoTRIgine 100 MG tablet  Commonly known as: LaMICtal  Medication Adjustments for Surgery: Take morning of surgery     medroxyPROGESTERone 150 MG/ML injection  Inject 150 mg into the muscle every 3 (three) months  Commonly known as: DEPO-PROVERA  Medication Adjustments for Surgery: Take as prescribed     venlafaxine 37.5 MG 24 hr capsule  Commonly known as: EFFEXOR-XR  Medication Adjustments for Surgery: Take as prescribed  Allergies   Allergen Reactions    Sulfa Antibiotics Hives     Family History   Adopted: Yes     Social History     Occupational History    Not on file   Tobacco Use    Smoking status: Never    Smokeless tobacco: Never   Vaping Use    Vaping Use: Never used   Substance and Sexual Activity    Alcohol use: Yes     Alcohol/week: 2.0 - 3.0 standard drinks     Types: 2 - 3 Glasses of wine per week     Comment: social    Drug use: No    Sexual activity: Not on file       Menstrual History:   LMP / Status  Injection     No LMP recorded (lmp unknown). Patient has had an injection.    Tubal Ligation?  No valid surgical or medical questions entered.             Exam Scores:   SDB score Risk Category: No Risk    PONV score      MST score MST Score: 0    Allergy score Risk Category: Low Risk    Frailty score         Visit Vitals  Ht 1.702 m (5\' 7" )   Wt 68 kg (150 lb)   LMP  (LMP Unknown)   BMI 23.49 kg/m

## 2020-12-20 ENCOUNTER — Encounter: Admission: RE | Payer: Self-pay | Source: Ambulatory Visit

## 2020-12-20 ENCOUNTER — Ambulatory Visit
Admission: RE | Admit: 2020-12-20 | Payer: BLUE CROSS/BLUE SHIELD | Source: Ambulatory Visit | Admitting: Orthopaedic Surgery

## 2020-12-20 SURGERY — ORIF, FINGER
Anesthesia: Monitor Anesthesia Care | Site: Finger | Laterality: Right

## 2021-06-26 ENCOUNTER — Ambulatory Visit (HOSPITAL_BASED_OUTPATIENT_CLINIC_OR_DEPARTMENT_OTHER): Payer: BLUE CROSS/BLUE SHIELD | Admitting: Nurse Practitioner

## 2021-07-08 ENCOUNTER — Ambulatory Visit (INDEPENDENT_AMBULATORY_CARE_PROVIDER_SITE_OTHER): Payer: BLUE CROSS/BLUE SHIELD | Admitting: Nurse Practitioner

## 2021-07-08 ENCOUNTER — Encounter (HOSPITAL_BASED_OUTPATIENT_CLINIC_OR_DEPARTMENT_OTHER): Payer: Self-pay | Admitting: Nurse Practitioner

## 2021-07-08 ENCOUNTER — Ambulatory Visit: Payer: Self-pay

## 2021-07-08 VITALS — BP 119/86 | HR 99 | Temp 97.9°F | Ht 67.0 in | Wt 162.8 lb

## 2021-07-08 DIAGNOSIS — Z3042 Encounter for surveillance of injectable contraceptive: Secondary | ICD-10-CM

## 2021-07-08 DIAGNOSIS — Z01419 Encounter for gynecological examination (general) (routine) without abnormal findings: Secondary | ICD-10-CM

## 2021-07-08 NOTE — Progress Notes (Signed)
CC/HPI    Tiffany Suarez is a 51 y.o. female, new patient, here today for annual gyn exam.    Has been on Depo x at least 25 years.  Last DEXA was in 2017 and was normal per pt.  Reports next injection due in 08/2021.    C/o weight gain.  Used to be very active, but hasn't been able to for the past several years as she continues to have injuries to various joints and cardiac issues.  Reports TSH WNL thru PCP.    GYN Hx   Sexually Active: not currently  Birth Control: Depo, x 25 years  Menses: 15-16/amenorrhea  Last pap: 05/2020, neg per pt  Hx abnormal paps: x1, early 2000s, ? results  Colposcopy: x1, early 2000s, neg per pt  Hx STDs:  denies  Last mammogram: 03/2021, WNL per pt (PCP)  Last DEXA: 2017, WNL per pt  Last colonoscopy: 10/2019, WNL per pt    OB Hx  [id number]    Allergies  Allergies   Allergen Reactions    Sulfa Antibiotics Hives    Topiramate Other (See Comments)       Medications  Current Outpatient Medications   Medication Sig Dispense Refill    lamoTRIgine (LaMICtal) 100 MG tablet       medroxyPROGESTERone (DEPO-PROVERA) 150 MG/ML injection Inject 1 mL (150 mg) into the muscle every 3 (three) months      ondansetron (ZOFRAN-ODT) 4 MG disintegrating tablet ondansetron 4 mg disintegrating tablet      rizatriptan (MAXALT-MLT) 5 MG disintegrating tablet DISSOLVE 1 TABLET BY MOUTH AS NEEDED. MAY REPEAT IN 2 HOURS      venlafaxine (EFFEXOR-XR) 37.5 MG 24 hr capsule        No current facility-administered medications for this visit.       PMH  Past Medical History:   Diagnosis Date    Abnormal Papanicolaou smear of cervix     Basal cell adenoma     stage 4 removed in office    Eczema     allergy induced    HPV in female     Post-operative nausea and vomiting     per pt she recieved IV medication for nausea    Syncope and collapse     18 years ago. Worked up by cardiologist, determined not to be cardiac related.     Vestibular migraine     was causing syncope, does not see neuro anymore           PSH  Past  Surgical History:   Procedure Laterality Date    BREAST BIOPSY  2022    right    CARDIAC ABLATION  2019    COLONOSCOPY      COLPOSCOPY      EGD      ELBOW SURGERY      HAND SURGERY Left 2021    thumb    KNEE ARTHROSCOPY Right 2020    with menicus repair    REPAIR, LOWER EXTREMITY TENDON Right 11/22/2014    Procedure: REPAIR, LOWER EXTREMITY PERONEAL TENDON ;  Surgeon: Kandice Robinsons, DPM;  Location: [facility];  Service: Podiatry;  Laterality: Right;    SHOULDER ARTHROSCOPY  2018    elbow surgery done at the same time    STABILIZATION, ANKLE, BROSTROM PROCEDURE Right 11/22/2014    Procedure: LATERAL ANKLE STABILIZATION; PERONEAL TENDON REPAIR;  Surgeon: Kandice Robinsons, DPM;  Location: [facility];  Service: Podiatry;  Laterality: Right;  SH  Social History     Socioeconomic History    Marital status: Single   Tobacco Use    Smoking status: Never     Passive exposure: Past    Smokeless tobacco: Never   Vaping Use    Vaping status: Never Used   Substance and Sexual Activity    Alcohol use: Yes     Alcohol/week: 2.0 - 3.0 standard drinks of alcohol     Types: 2 - 3 Glasses of wine per week     Comment: social    Drug use: Never    Sexual activity: Not Currently     Partners: Male     Birth control/protection: Injection     Comment: depo       FH  Family History   Adopted: Yes       VITAL SIGNS  BP 119/86   Pulse 99   Temp 97.9 F (36.6 C)   Ht 5\' 7"  (1.702 m)   Wt 162 lb 12.8 oz (73.8 kg)   LMP  (LMP Unknown) Comment: depo  BMI 25.50 kg/m     EXAM  GENERAL APPEARANCE    Alert, Well appearing, Oriented x 3, In no distress    BREAST  Sexual Maturity:  Tanner 5  Nipples:  Left - normal, no discharge; Right - normal, no discharge  Breast:  symmetrical; Left - normal, non-tender, no mass; Right - normal, non-tender, no mass    PELVIC  Vulva:  normal, no lesions, normal clitorus  Perineum:  intact  Introitus:  normal, no lesions, no discharge  Urethra:  normal  Vagina:  normal, no lesions, no  masses, no tenderness  Cervix:  normal, no discharge, no lesions, non-friable  Uterus:  normal, not enlarged, no masses  Adnexa:  Left - normal, non-tender, not enlarged; Right - normal, non-tender, not enlarged  Rectal:  deferred    ASSESSMENT  1. Encounter for gynecological examination (general) (routine) without abnormal findings    2. Surveillance of contraceptive injection        PLAN  Orders Placed This Encounter   Procedures    Dxa Bone Density Axial Skeleton     Pt aware normal results will be sent via My Chart.  Pap/HPV today.  Repeat DEXA this year.  Will schedule nurse visit for upcoming Depo in 08/2021.  Reviewed that weight gain is likely related to decreased activity and less likely to hormones.  Discussed coming off of Depo around age 33.    Alston Berrie L. Mattye Verdone, MS, RN, WHNP-BC

## 2021-07-09 LAB — THIN PREP-PAP W/IMAGING & HPV DNA (HIGH RISK W/16/18)
HPV Genotype, 16: NEGATIVE
HPV Genotype, 18: NEGATIVE
HPV High Risk, Other: NEGATIVE

## 2021-07-15 LAB — LAB USE ONLY - HISTORICAL GYN CYTOLOGY/PAP SMEAR

## 2021-07-22 ENCOUNTER — Ambulatory Visit: Payer: BLUE CROSS/BLUE SHIELD | Attending: Nurse Practitioner

## 2021-07-22 DIAGNOSIS — Z3042 Encounter for surveillance of injectable contraceptive: Secondary | ICD-10-CM | POA: Insufficient documentation

## 2021-08-15 ENCOUNTER — Encounter (HOSPITAL_BASED_OUTPATIENT_CLINIC_OR_DEPARTMENT_OTHER): Payer: Self-pay | Admitting: Nurse Practitioner

## 2021-09-27 ENCOUNTER — Other Ambulatory Visit: Payer: Self-pay | Admitting: Neurology

## 2021-09-27 DIAGNOSIS — M5412 Radiculopathy, cervical region: Secondary | ICD-10-CM

## 2021-09-30 ENCOUNTER — Other Ambulatory Visit: Payer: Self-pay | Admitting: Neurology

## 2021-09-30 ENCOUNTER — Ambulatory Visit
Admission: RE | Admit: 2021-09-30 | Discharge: 2021-09-30 | Disposition: A | Discharge status: Home / Self Care | Payer: BLUE CROSS/BLUE SHIELD | Source: Ambulatory Visit | Attending: Neurology | Admitting: Neurology

## 2021-09-30 DIAGNOSIS — M5412 Radiculopathy, cervical region: Secondary | ICD-10-CM

## 2021-10-23 ENCOUNTER — Other Ambulatory Visit: Payer: Self-pay

## 2021-10-23 DIAGNOSIS — G5601 Carpal tunnel syndrome, right upper limb: Secondary | ICD-10-CM | POA: Insufficient documentation

## 2021-10-24 ENCOUNTER — Other Ambulatory Visit: Payer: Self-pay

## 2021-11-24 ENCOUNTER — Ambulatory Visit (INDEPENDENT_AMBULATORY_CARE_PROVIDER_SITE_OTHER): Payer: BLUE CROSS/BLUE SHIELD

## 2021-11-24 VITALS — BP 126/85 | HR 108 | Temp 98.3°F | Wt 161.0 lb

## 2021-11-24 DIAGNOSIS — Z3042 Encounter for surveillance of injectable contraceptive: Secondary | ICD-10-CM

## 2021-11-24 MED ORDER — MEDROXYPROGESTERONE 150 MG/ML IM (WRAP)
150.0000 mg | Freq: Once | INTRAMUSCULAR | Status: AC
Start: 2021-11-24 — End: 2021-11-24
  Administered 2021-11-24: 150 mg via INTRAMUSCULAR

## 2021-11-24 MED ORDER — MEDROXYPROGESTERONE ACETATE 150 MG/ML IM SUSP
150.0000 mg | INTRAMUSCULAR | 2 refills | Status: AC
Start: 2021-11-24 — End: ?

## 2021-11-24 NOTE — Progress Notes (Signed)
Depo Injection    Site: LD  Lot: [id number]  Exp: 02/15/2025  NDC: 0454-0981-19    Next Depo: Dec 25-Jan 8    Depo Provera injection supplied by office.      After obtaining consent, and per orders of Jacqlyn Larsen, NP, injection of Depo Provera given by Elder Negus, CMA. Patient instructed to remain in clinic for 20 minutes afterwards, and to report any adverse reaction to me immediately.

## 2021-11-27 ENCOUNTER — Encounter (INDEPENDENT_AMBULATORY_CARE_PROVIDER_SITE_OTHER): Payer: Self-pay

## 2021-11-27 ENCOUNTER — Ambulatory Visit (INDEPENDENT_AMBULATORY_CARE_PROVIDER_SITE_OTHER): Payer: BLUE CROSS/BLUE SHIELD

## 2021-11-27 NOTE — PSS Phone Screening (Signed)
Pre-Anesthesia Evaluation    Pre-op phone visit requested by:   Reason for pre-op phone visit: Patient anticipating RIGHT CARPAL TUNNEL RELEASE procedure.    Language Assistant  Interpreter: N/A - English is preferred language    No orders of the defined types were placed in this encounter.      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   01/17/2015    Closed displaced fracture of middle phalanx of right ring finger with routine healing S62.624D   12/17/2020    Overview Signed 12/17/2020 10:25 AM by Arville Care     Added automatically from request for surgery [id number]         Carpal tunnel syndrome of right wrist G56.01   10/23/2021        Medical History   Diagnosis Date    Abnormal Papanicolaou smear of cervix     Anxiety     Arrhythmia     had ablation    Arthritis     Basal cell adenoma     stage 4 removed in office    Eczema     allergy induced    HPV in female     Post-operative nausea and vomiting     per pt she recieved IV medication for nausea    Syncope and collapse     18 years ago. Worked up by cardiologist, determined not to be cardiac related.     Vestibular migraine     "silent migraine" starts with "ringing in ears", then vertigo     Past Surgical History:   Procedure Laterality Date    BREAST BIOPSY  2022    right    CARDIAC ABLATION  2019    COLONOSCOPY      COLPOSCOPY      EGD      ELBOW SURGERY      HAND SURGERY Left 2021    thumb    KNEE ARTHROSCOPY Left 2020    with menicus repair    REPAIR, LOWER EXTREMITY TENDON Right 11/22/2014    Procedure: REPAIR, LOWER EXTREMITY PERONEAL TENDON ;  Surgeon: Kandice Robinsons, DPM;  Location: [facility];  Service:  Podiatry;  Laterality: Right;    SHOULDER ARTHROSCOPY  2018    elbow surgery done at the same time    STABILIZATION, ANKLE, BROSTROM PROCEDURE Right 11/22/2014    Procedure: LATERAL ANKLE STABILIZATION; PERONEAL TENDON REPAIR;  Surgeon: Kandice Robinsons, DPM;  Location: [facility];  Service: Podiatry;  Laterality: Right;        Medication List            Accurate as of November 27, 2021  3:14 PM. Always use your most recent med list.                diphenhydrAMINE 25 MG tablet  Take 1 tablet (25 mg) by mouth every 8 (eight) hours as needed  Commonly known as: BENADRYL  Medication Adjustments for Surgery: Take as needed  lamoTRIgine 100 MG tablet  1 tablet (100 mg) every morning  Commonly known as: LaMICtal  Medication Adjustments for Surgery: Take morning of surgery     * medroxyPROGESTERone 150 MG/ML injection  Inject 1 mL (150 mg) into the muscle every 3 (three) months  Commonly known as: DEPO-PROVERA     * medroxyPROGESTERone 150 MG/ML injection  Inject 1 mL (150 mg) into the muscle every 3 (three) months  Commonly known as: DEPO-PROVERA     OMEPRAZOLE PO  Take by mouth every morning  Medication Adjustments for Surgery: Take morning of surgery     ondansetron 4 MG disintegrating tablet  ondansetron 4 mg disintegrating tablet  Commonly known as: ZOFRAN-ODT  Medication Adjustments for Surgery: Take as needed     rizatriptan 5 MG disintegrating tablet  DISSOLVE 1 TABLET BY MOUTH AS NEEDED. MAY REPEAT IN 2 HOURS  Commonly known as: MAXALT-MLT  Medication Adjustments for Surgery: Take as needed     venlafaxine 37.5 MG 24 hr capsule  2 capsules (75 mg) every morning  Commonly known as: EFFEXOR-XR  Medication Adjustments for Surgery: Take morning of surgery           * This list has 2 medication(s) that are the same as other medications prescribed for you. Read the directions carefully, and ask your doctor or other care provider to review them with you.                Allergies   Allergen Reactions    Sulfa  Antibiotics Hives    Topiramate Other (See Comments)     Took for migraine, "too many side affects"     Family History   Adopted: Yes     Social History     Occupational History    Not on file   Tobacco Use    Smoking status: Never     Passive exposure: Past    Smokeless tobacco: Never   Vaping Use    Vaping Use: Never used   Substance and Sexual Activity    Alcohol use: Yes     Alcohol/week: 2.0 standard drinks of alcohol     Types: 2 Glasses of wine per week     Comment: social    Drug use: Never    Sexual activity: Not Currently     Partners: Male     Birth control/protection: Injection     Comment: depo       Menstrual History:   LMP / Status  Injection     No LMP recorded. Patient has had an injection.    Tubal Ligation?  No valid surgical or medical questions entered.             Exam Scores:   SDB score  Risk Category: No Risk    STBUR score       PONV score  Nausea Risk: VERY SEVERE RISK    MST score  MST Score: 0    Allergy score  Risk Category: Low Risk    Frailty score          Visit Vitals  Ht 1.689 m (5' 6.5")   Wt 72.1 kg (159 lb)   BMI 25.28 kg/m

## 2021-12-04 NOTE — Discharge Instr - AVS First Page (Addendum)
POST OPERATIVE INSTRUCTIONS  Please follow the instructions listed below.  If you have questions or concerns regarding your surgery or these instructions, please call Dr. Pan.      DIET  Advance diet slowly, starting with liquids    ANESTHESIA  Rest is recommended for the first 24 hours. Some medications you may have received can take up to 24 hours to leave your system completely. For this reason, you should not ingest any alcoholic beverages, drive a car, operate machinery, or make any important decisions for 24 hours after your surgery. If you received a nerve block, you may notice effects for over 6 hours after surgery.    ACTIVITY  After the first 24 hours, you should use the operative hand to the extent that your dressing will allow. Make a fist 20 times an hour. This will help decrease swelling, especially in the fingers, and help to decrease stiffness after surgery.     PAIN CONTROL  There will be some discomfort or pain at the operative site. This can be lessened by the use of an ice pack, elevating your hand above the level of your heart, and by the use of pain medication as needed. Your hand will be sensitive to swelling so keep it elevated as much as possible for the first 3-5 days. If you are allowed to take anti-inflammatories and Tylenol, try alternating these medications after you run out of pain medication.    INCISION CARE  Following your surgery, you will have a dressing on your arm or hand. Avoid getting the dressing wet the first 5 days. You may remove the dressing in 5 days. After this, you may wash your hand or arm with soap and water but do not soak and do not apply any ointments to the incisions. Keep the incision covered with a dry gauze and wrap or a band-aid to protect the stitches. If you notice a large amount of drainage from the incision or on the bandage, please contact the office.    WHEN TO CALL THE DOCTOR/OFFICE  Report any complications to the office immediately. This includes  excessive bleeding, wound breakdown, signs of an infection, excessive pain, fever over 101 degrees, or an extremity that becomes cold, blue, or numb. If you develop chest pain, difficulty breathing, or are unable to urinate after 6-8 hours, call your family doctor or visit the nearest emergency department.  If there are any questions of problems, feel free to contact the office at [phone].    Prescription refills will only be done during normal business hours. No narcotic refills will be called in after hours. After one refill, you are required to make an appointment to be evaluated before a second refill will be considered.    Post Anesthesia Discharge Instructions    Although you may be awake and alert in the recovery room, small amounts of anesthetic remain in your system for about 24 hours. You may feel tired and sleepy during this time.    You are advised to go directly home from the hospital.    Plan to stay at home and rest for the remainder of the day.    It is advisable to have someone with you at home for 24 hours after surgery.    Do not operate a motor vehicle, or any mechanical or electrical equipment for the next 24 hours.    Be careful when you are walking around, you may become dizzy. The effects of anesthesia and/or medications are still   present and drowsiness may occur.    Do not consume alcohol, tranquilizers, sleeping medications, or any other non prescribed medications for the remainder of the day.    Diet: Begin with liquids, progress your diet as tolerated or as directed by your surgeon. Nausea and vomiting may occur in the next 24 hours.

## 2021-12-04 NOTE — Op Note (Signed)
FULL OPERATIVE NOTE    Date Time: 12/05/21 4:13 PM  Patient Name: Tiffany Suarez, Tiffany Suarez (MRN: [id number])  Attending Physician: Janeal Holmes, MD      Date of Operation:   12/05/2021    Providers Performing:   Surgeon(s) and Role:     * Ramona Ruark, Franchot Mimes, MD - Primary    Surgical First Assistant(s):   First Assistant: Lerry Paterson    Operative Procedure:   Right carpal tunnel release    Preoperative Diagnosis:   Right carpal tunnel syndrome    Postoperative Diagnosis:   Right carpal tunnel syndrome    Anesthesia:   Monitor Anesthesia Care    Indications:   51 year old right hand dominant female who presented with right carpal tunnel syndrome. She has had one prior carpal tunnel injection which did help partially but only temporarily. She describes significant stiffness at night and numbness in the finger tips. Ring finger range of motion is fairly stable and she is mildly tender over the A1 pulley but without triggering. We discussed that I think this is related to joint stiffness more than tendon stenosis. Regarding the carpal tunnel, she did have improvement with the injection and given her continued symptoms, I think she would benefit from surgical release. Previous nerve study showed right median nerve sensory latency is 3.79 ms, motor latency is 4.98 ms, and ulnar nerve conduction velocity is 61 m/s. The patient has failed conservative management including night splints and activity modification and is requesting surgical intervention. The procedure is indicated based on both clinical presentation and confirmatory testing. Risks of surgery include, but are not limited to, the risk of anesthesia, bleeding, infection, damage to arteries tendons and nerves, persistent symptoms, recurrent symptoms, need for further surgery, RSD, other unknown risks, scar sensitivity, persistent numbness or weakness, recurrent numbness or weakness and the need for extensive postoperative therapy and immobilization. The patient understands  these risks and wishes to proceed with right carpal tunnel release. Informed consent was obtained from the patient.     Operative Notes:   Patient was identified in the preoperative holding area.  The right  hand was marked as the correct surgical site with the patient's agreement. She was taken back to the operating room and moved onto the OR table with all bony prominences well padded.  Preoperative antibiotics were not given for a clean elective hand procedure.  Sedation was induced by the anesthesia service.  10 cc of 1% lidocaine and 0.25% bupivacaine were injected into the palm at the incision site.  A forearm tourniquet was placed and the right  hand was prepped and draped in the usual sterile fashion. A time out was performed confirming the correct patient, surgical site, and procedure. An Esmarch was applied to the arm and the tourniquet was inflated to . A longitudinal incision was made in the palm in line with the radial border of the 4th ray.  Sharp dissection was carried down through skin and tenotomies were used to dissect through subcutaneous fat and palmar fascia.  The volar carpal ligament was released proximally.  The transverse carpal ligament was exposed distally to the level of the superficial arch.  The hook of the hamate was palpated and a 15 blade was used to sharply release the transverse carpal ligament just radial to this.  The release was completed proximally and distally with tenotomy scissors. The ligament was moderately thickened but significantly tight especially into the wrist. The median nerve was visualized and was intact.  The wound was then copiously irrigated and the skin was closed with 4 0 nylon horizontal mattress sutures.  The tourniquet was released.  The incision was dressed with bacitracin ointment, adaptic, 4x4s, sterile Webril, and an ABD pad. The patient was woken from sedation and taken to the recovery room in stable condition.        Estimated Blood Loss:   *  No values recorded between 12/05/2021  3:47 PM and 12/05/2021  4:13 PM *    Implants:   * No implants in log *       Drains:   Drains: no      Specimens:   * No specimens in log *      Complications:   None    Signed by: Janeal Holmes, MD  [facility]

## 2021-12-05 ENCOUNTER — Encounter: Payer: Self-pay | Admitting: Orthopaedic Surgery

## 2021-12-05 ENCOUNTER — Ambulatory Visit: Payer: BLUE CROSS/BLUE SHIELD | Admitting: Anesthesiology

## 2021-12-05 ENCOUNTER — Ambulatory Visit
Admission: RE | Admit: 2021-12-05 | Discharge: 2021-12-05 | Disposition: A | Discharge status: Home / Self Care | Payer: BLUE CROSS/BLUE SHIELD | Source: Ambulatory Visit | Attending: Orthopaedic Surgery | Admitting: Orthopaedic Surgery

## 2021-12-05 ENCOUNTER — Encounter
Admission: RE | Disposition: A | Discharge status: Home / Self Care | Payer: Self-pay | Source: Ambulatory Visit | Attending: Orthopaedic Surgery

## 2021-12-05 DIAGNOSIS — M7710 Lateral epicondylitis, unspecified elbow: Secondary | ICD-10-CM | POA: Insufficient documentation

## 2021-12-05 DIAGNOSIS — G5601 Carpal tunnel syndrome, right upper limb: Secondary | ICD-10-CM | POA: Insufficient documentation

## 2021-12-05 HISTORY — PX: RELEASE, CARPAL TUNNEL: SHX5072

## 2021-12-05 SURGERY — RELEASE, CARPAL TUNNEL
Anesthesia: Anesthesia General | Site: Wrist | Laterality: Right | Wound class: Clean

## 2021-12-05 MED ORDER — PROPOFOL INFUSION 10 MG/ML
INTRAVENOUS | Status: DC | PRN
Start: 2021-12-05 — End: 2021-12-05
  Administered 2021-12-05: 100 ug/kg/min via INTRAVENOUS

## 2021-12-05 MED ORDER — PROPOFOL 10 MG/ML IV EMUL (WRAP)
INTRAVENOUS | Status: AC
Start: 2021-12-05 — End: ?
  Filled 2021-12-05: qty 40

## 2021-12-05 MED ORDER — PROPOFOL 10 MG/ML IV EMUL (WRAP)
INTRAVENOUS | Status: DC | PRN
Start: 2021-12-05 — End: 2021-12-05
  Administered 2021-12-05: 40 mg via INTRAVENOUS
  Administered 2021-12-05 (×2): 30 mg via INTRAVENOUS

## 2021-12-05 MED ORDER — ONDANSETRON HCL 4 MG/2ML IJ SOLN
INTRAMUSCULAR | Status: DC | PRN
Start: 2021-12-05 — End: 2021-12-05
  Administered 2021-12-05: 4 mg via INTRAVENOUS

## 2021-12-05 MED ORDER — EPHEDRINE SULFATE 50 MG/ML IJ/IV SOLN (WRAP)
Status: AC
Start: 2021-12-05 — End: ?
  Filled 2021-12-05: qty 1

## 2021-12-05 MED ORDER — LIDOCAINE HCL (PF) 1 % IJ SOLN
INTRAMUSCULAR | Status: AC
Start: 2021-12-05 — End: ?
  Filled 2021-12-05: qty 5

## 2021-12-05 MED ORDER — BUPIVACAINE HCL (PF) 0.5 % IJ SOLN
INTRAMUSCULAR | Status: DC | PRN
Start: 2021-12-05 — End: 2021-12-05
  Administered 2021-12-05: 5 mL

## 2021-12-05 MED ORDER — MIDAZOLAM HCL 1 MG/ML IJ SOLN (WRAP)
INTRAMUSCULAR | Status: DC | PRN
Start: 2021-12-05 — End: 2021-12-05
  Administered 2021-12-05: 2 mg via INTRAVENOUS

## 2021-12-05 MED ORDER — ONDANSETRON HCL 4 MG/2ML IJ SOLN
INTRAMUSCULAR | Status: AC
Start: 2021-12-05 — End: ?
  Filled 2021-12-05: qty 2

## 2021-12-05 MED ORDER — BACITRACIN +/- ZINC 500 UNIT/GM EX OINT (WRAP)
TOPICAL_OINTMENT | CUTANEOUS | Status: DC | PRN
Start: 2021-12-05 — End: 2021-12-05
  Administered 2021-12-05: 1 via TOPICAL

## 2021-12-05 MED ORDER — DEXAMETHASONE SODIUM PHOSPHATE 4 MG/ML IJ SOLN (WRAP)
INTRAMUSCULAR | Status: DC | PRN
Start: 2021-12-05 — End: 2021-12-05
  Administered 2021-12-05: 4 mg via INTRAVENOUS

## 2021-12-05 MED ORDER — FENTANYL CITRATE (PF) 50 MCG/ML IJ SOLN (WRAP)
INTRAMUSCULAR | Status: AC
Start: 2021-12-05 — End: ?
  Filled 2021-12-05: qty 2

## 2021-12-05 MED ORDER — FENTANYL CITRATE (PF) 50 MCG/ML IJ SOLN (WRAP)
INTRAMUSCULAR | Status: DC | PRN
Start: 2021-12-05 — End: 2021-12-05
  Administered 2021-12-05 (×2): 25 ug via INTRAVENOUS

## 2021-12-05 MED ORDER — DEXAMETHASONE SODIUM PHOSPHATE 4 MG/ML IJ SOLN
INTRAMUSCULAR | Status: AC
Start: 2021-12-05 — End: ?
  Filled 2021-12-05: qty 1

## 2021-12-05 MED ORDER — LIDOCAINE HCL 1 % IJ SOLN
INTRAMUSCULAR | Status: DC | PRN
Start: 2021-12-05 — End: 2021-12-05
  Administered 2021-12-05: 5 mL

## 2021-12-05 MED ORDER — LIDOCAINE HCL (PF) 1 % IJ SOLN
INTRAMUSCULAR | Status: DC | PRN
Start: 2021-12-05 — End: 2021-12-05
  Administered 2021-12-05: 40 mg via INTRAVENOUS

## 2021-12-05 MED ORDER — PHENYLEPHRINE 100 MCG/ML IV SOSY (WRAP)
PREFILLED_SYRINGE | INTRAVENOUS | Status: AC
Start: 2021-12-05 — End: ?
  Filled 2021-12-05: qty 10

## 2021-12-05 MED ORDER — LACTATED RINGERS IV SOLN
INTRAVENOUS | Status: DC | PRN
Start: 2021-12-05 — End: 2021-12-05

## 2021-12-05 MED ORDER — MIDAZOLAM HCL 1 MG/ML IJ SOLN (WRAP)
INTRAMUSCULAR | Status: AC
Start: 2021-12-05 — End: ?
  Filled 2021-12-05: qty 2

## 2021-12-05 MED ORDER — SODIUM CHLORIDE (PF) 0.9 % IJ SOLN
INTRAMUSCULAR | Status: AC
Start: 2021-12-05 — End: ?
  Filled 2021-12-05: qty 10

## 2021-12-05 SURGICAL SUPPLY — 54 items
APPLICATOR CHLORAPREP 26 ML 70% ISOPROPYL ALCOHOL 2% CHLORHEXIDINE (Applicator) ×2 IMPLANT
APPLICATOR PRP 70% ISPRP 2% CHG 26ML (Applicator) ×2
BANDAGE CMPR FM CFLX LF2 5YDX2IN LF STRL (Bandage) ×1
BANDAGE CMPR PLSTR CTTN CRTY 75X2IN LF (Bandage)
BANDAGE COMPRESSION L5 YD X W2 IN COHESIVE SELF ADHERENT FOAM TAN (Bandage) ×1 IMPLANT
BANDAGE COMPRESSION L75 IN X W2 IN 1 PLY (Bandage) IMPLANT
BANDAGE MEDLINE COMPRESSION L5 YD X W2 (Bandage) ×1
CORD BIPOLAR L12 FT CAUTERY MEDLINE (Cautery) ×1
CORD BIPOLAR L12 FT CAUTERY STANDARD FORCEPS (Cautery) ×1 IMPLANT
CORD BP 12FT LF STRL CAUT STD FCP (Cautery) ×1
COVER FLEXIBLE MEDLINE LIGHT HANDLE (Drape) ×1
COVER FLEXIBLE MEDLINE LIGHT HANDLE PLASTIC GREEN (Drape) ×1 IMPLANT
COVER LGHT HNDL PLS LF STRL FLXB DISP (Drape) ×1
CUFF TOURNIQUET CYLINDRICAL L18 IN X W4 (Procedure Accessories) ×1
CUFF TOURNIQUET CYLINDRICAL L18 IN X W4 IN 2 PORT BLADDER ATS (Procedure Accessories) ×1 IMPLANT
CUFF TRNQT CYL ATS 18X4IN LF STRL 2 PORT (Procedure Accessories) ×1
DRAPE SRG PLS SM INVISISHIELD 18X12IN LF (Drape) ×1
DRAPE SRG PLS U STRDRP 51X47IN LF STRL (Drape) ×1
DRAPE SURGICAL ADHESIVE L51 IN X W47 IN (Drape) ×1
DRAPE SURGICAL ADHESIVE L51 IN X W47 IN STERI-DRAPE CLEAR (Drape) ×1 IMPLANT
DRAPE SURGICAL TOWEL MULTIPURPOSE (Drape) ×1
DRAPE SURGICAL TOWEL MULTIPURPOSE ADHESIVE LOW GLARE MATTE FINISH L18 (Drape) ×1 IMPLANT
DRESSING EMLSN OIL CURITY 3X8 (Dressing) ×1 IMPLANT
GLOVE SRG PLISPRN 7 BGL PI INDCTR (Glove) ×1
GLOVE SRG PLISPRN 7 BGL PI ULTRATOUCH G (Glove) ×1
GLOVE SURGICAL 7 BIOGEL PI INDICATOR (Glove) ×1
GLOVE SURGICAL 7 BIOGEL PI INDICATOR UNDERGLOVE POWDER FREE SMOOTH (Glove) ×1 IMPLANT
GLOVE SURGICAL 7 BIOGEL PI ULTRATOUCH G (Glove) ×1
GLOVE SURGICAL 7 BIOGEL PI ULTRATOUCH G POWDER FREE BEAD CUFF (Glove) ×1 IMPLANT
GOWN SRG LG SMARTGOWN LF STRL LVL 4 (Gown) ×1
GOWN SRGCL LG LEVEL 4 BRTHBL STRL LF DSPSBL SMARTGOWN (Gown) ×1 IMPLANT
KIT RM TURNOVER LF NS DISP (Kits) ×1
KIT ROOM TURNOVER NONSTERILE LATEX FREE DISPOSABLE (Kits) ×1 IMPLANT
NEEDLE HPO SS PP RW BD 25GA 5/8IN LF (Needles) ×1 IMPLANT
NEEDLE HPO SS PP RW PRCSNGL 18GA 1.5IN (Needles) ×1
NEEDLE HYPODERMIC L1 1/2 IN OD18 GA REGULAR WALL BD PRECISIONGLIDE (Needles) ×1 IMPLANT
PAD ABD PVC CRTY 9X5IN LF STRL 3 LYR (Dressing) ×1 IMPLANT
PAD ARMBOARD FOAM 20X8X2IN (Positioning Supplies) ×1
PAD ARMBOARD L20 IN X W8 IN X H2 IN (Positioning Supplies) ×1
PAD ARMBOARD L20 IN X W8 IN X H2 IN CONVOLUTE FOAM PURPLE (Positioning Supplies) ×1 IMPLANT
PADDING CAST L4 YD X W2 IN UNDERCAST (Other) ×1
PADDING CAST L4 YD X W2 IN UNDERCAST COTTON (Other) ×1 IMPLANT
PADDING CST CTTN WYTEX 4YDX2IN LF STRL (Other) ×1
SOLUTION IRR 0.9% NACL 500ML LF STRL PLS (Irrigation Solutions) ×1
SOLUTION IRRIGATION 0.9% SDM CHLORIDE 500ML PR BTTL ISOTONIC NONPRGNC (Irrigation Solutions) ×1 IMPLANT
SOLUTION IRRIGATION 0.9% SODIUM CHLORIDE (Irrigation Solutions) ×1
SPONGE GAUZE L4 IN X W4 IN 12 PLY (Sponge) ×2 IMPLANT
SPONGE GZE PLS CTTN CRTY 4X4IN LF STRL (Sponge) ×2
SUTURE NABSB 4-0 PS2 ETH MTPS 18IN MFL (Suture) ×1 IMPLANT
SYRINGE 10 ML GRADUATE NONPYROGENIC DEHP (Syringes, Needles) ×2
SYRINGE 10 ML GRADUATE NONPYROGENIC DEHP FREE PVC FREE LOK MEDICAL (Syringes, Needles) ×2 IMPLANT
SYRINGE MED 10ML LL LF STRL GRAD N-PYRG (Syringes, Needles) ×2
TRAY SRG HAND UPPER EXTREMITY ~~LOC~~ (Pack) ×1
TRAY SURGICAL HAND UPPER EXTREMITY ~~LOC~~ (Pack) ×1 IMPLANT

## 2021-12-05 NOTE — H&P (Signed)
ADMISSION HISTORY AND PHYSICAL EXAM    Date Time: 12/05/21 3:28 PM  Patient Name: Tiffany Suarez  Attending Physician: Janeal Holmes, MD    Assessment:   51 year old right hand dominant female who presented with right carpal tunnel syndrome. She has had one prior carpal tunnel injection which did help partially but only temporarily. She describes significant stiffness at night and numbness in the finger tips. Ring finger range of motion is fairly stable and she is mildly tender over the A1 pulley but without triggering. We discussed that I think this is related to joint stiffness more than tendon stenosis. Regarding the carpal tunnel, she did have improvement with the injection and given her continued symptoms, I think she would benefit from surgical release. Previous nerve study showed right median nerve sensory latency is 3.79 ms, motor latency is 4.98 ms, and ulnar nerve conduction velocity is 61 m/s.     Plan:   The patient has failed conservative management including night splints and activity modification and is requesting surgical intervention. The procedure is indicated based on both clinical presentation and confirmatory testing. Risks of surgery include, but are not limited to, the risk of anesthesia, bleeding, infection, damage to arteries tendons and nerves, persistent symptoms, recurrent symptoms, need for further surgery, RSD, other unknown risks, scar sensitivity, persistent numbness or weakness, recurrent numbness or weakness and the need for extensive postoperative therapy and immobilization. The patient understands these risks and wishes to proceed with right carpal tunnel release. Informed consent was obtained from the patient.     History of Present Illness:   LORRAINA Suarez is a 51 y.o. female who presents to the hospital with right carpal tunnel    Past Medical History:     Past Medical History:   Diagnosis Date    Abnormal Papanicolaou smear of cervix     Anxiety     Arrhythmia     had  ablation    Arthritis     Basal cell adenoma     stage 4 removed in office    Eczema     allergy induced    HPV in female     Post-operative nausea and vomiting     per pt she recieved IV medication for nausea    Syncope and collapse     18 years ago. Worked up by cardiologist, determined not to be cardiac related.     Vestibular migraine     "silent migraine" starts with "ringing in ears", then vertigo       Past Surgical History:     Past Surgical History:   Procedure Laterality Date    BREAST BIOPSY  2022    right    CARDIAC ABLATION  2019    COLONOSCOPY      COLPOSCOPY      EGD      ELBOW SURGERY      HAND SURGERY Left 2021    thumb    KNEE ARTHROSCOPY Left 2020    with menicus repair    REPAIR, LOWER EXTREMITY TENDON Right 11/22/2014    Procedure: REPAIR, LOWER EXTREMITY PERONEAL TENDON ;  Surgeon: Kandice Robinsons, DPM;  Location: [facility];  Service: Podiatry;  Laterality: Right;    SHOULDER ARTHROSCOPY  2018    elbow surgery done at the same time    STABILIZATION, ANKLE, BROSTROM PROCEDURE Right 11/22/2014    Procedure: LATERAL ANKLE STABILIZATION; PERONEAL TENDON REPAIR;  Surgeon: Kandice Robinsons, DPM;  Location:  [facility];  Service: Podiatry;  Laterality: Right;       Family History:     Family History   Adopted: Yes       Social History:     Social History     Socioeconomic History    Marital status: Single     Spouse name: Not on file    Number of children: Not on file    Years of education: Not on file    Highest education level: Not on file   Occupational History    Not on file   Tobacco Use    Smoking status: Never     Passive exposure: Past    Smokeless tobacco: Never   Vaping Use    Vaping Use: Never used   Substance and Sexual Activity    Alcohol use: Yes     Alcohol/week: 2.0 standard drinks of alcohol     Types: 2 Glasses of wine per week     Comment: social    Drug use: Never    Sexual activity: Not Currently     Partners: Male     Birth control/protection: Injection     Comment:  depo   Other Topics Concern    Not on file   Social History Narrative    Not on file     Social Determinants of Health     Financial Resource Strain: Not on file   Food Insecurity: Not on file   Transportation Needs: Not on file   Physical Activity: Not on file   Stress: Not on file   Social Connections: Not on file   Intimate Partner Violence: Not on file   Housing Stability: Not on file       Allergies:     Allergies   Allergen Reactions    Sulfa Antibiotics Hives    Topiramate Other (See Comments)     Took for migraine, "too many side affects"       Medications:     Medications Prior to Admission   Medication Sig    lamoTRIgine (LaMICtal) 100 MG tablet 1 tablet (100 mg) every morning    OMEPRAZOLE PO Take by mouth every morning    ondansetron (ZOFRAN-ODT) 4 MG disintegrating tablet     rizatriptan (MAXALT-MLT) 5 MG disintegrating tablet     venlafaxine (EFFEXOR-XR) 37.5 MG 24 hr capsule 2 capsules (75 mg) every morning    diphenhydrAMINE (BENADRYL) 25 MG tablet Take 1 tablet (25 mg) by mouth every 8 (eight) hours as needed    medroxyPROGESTERone (DEPO-PROVERA) 150 MG/ML injection Inject 1 mL (150 mg) into the muscle every 3 (three) months    medroxyPROGESTERone (DEPO-PROVERA) 150 MG/ML injection Inject 1 mL (150 mg) into the muscle every 3 (three) months       Review of Systems:   A comprehensive review of systems was: Negative except right hand numbness    Physical Exam:     Vitals:    12/05/21 1257   BP: 132/89   Pulse: 92   Resp: 14   Temp: 98.1 F (36.7 C)   SpO2: 97%       Intake and Output Summary (Last 24 hours) at Date Time  No intake or output data in the 24 hours ending 12/05/21 1528    General appearance - alert, well appearing, and in no distress  Mental status - alert, oriented to person, place, and time  Eyes - pupils equal and reactive, extraocular eye movements intact  Ears -  bilateral external ear canals normal  Nose - normal and patent, no erythema, discharge  Mouth - mucous membranes moist,  pharynx normal  Neck - supple  Chest - clear to auscultation, symmetric air entry  Heart - normal rate, regular rhythm  Abdomen - soft, nontender, nondistended  Neurological - alert, oriented, normal speech, no focal findings or movement disorder noted  Musculoskeletal - no joint tenderness, deformity or swelling  Extremities - no pedal edema, no clubbing or cyanosis  Skin - normal coloration and turgor, no rashes, no suspicious skin lesions noted      Labs:     Results       ** No results found for the last 24 hours. **                Rads:   Radiological Procedure reviewed.    Signed by: Janeal Holmes, MD

## 2021-12-05 NOTE — Anesthesia Preprocedure Evaluation (Signed)
Anesthesia Evaluation    AIRWAY    Mallampati: II    TM distance: >3 FB  Neck ROM: full  Mouth Opening:full  Planned to use difficult airway equipment: No CARDIOVASCULAR    cardiovascular exam normal, regular and normal       DENTAL    no notable dental hx               PULMONARY    pulmonary exam normal and clear to auscultation     OTHER FINDINGS                                      Relevant Problems   OTHER   (+) Lateral epicondylitis (tennis elbow)       PSS Anesthesia Comments: Post-operative nausea and vomiting Syncope and collapse  Eczema Vestibular migraine  Basal cell adenoma Abnormal Papanicolaou smear of cervix  HPV in female Arrhythmia  Arthritis Anxiety    Surgical History      STABILIZATION, ANKLE, BROSTROM PROCEDURE REPAIR, LOWER EXTREMITY TENDON  CARDIAC ABLATION HAND SURGERY  KNEE ARTHROSCOPY SHOULDER ARTHROSCOPY  ELBOW SURGERY COLONOSCOPY  EGD COLPOSCOPY  BREAST BIOPSY             Anesthesia Plan    ASA 2     general                     intravenous induction   Detailed anesthesia plan: general IV      Post Op: plan for postoperative opioid use  Trial extubation is planned.  Post op pain management: per surgeon    informed consent obtained    Plan discussed with CRNA.    ECG reviewed  pertinent labs reviewed  imaging results reviewed           Signed by: Hinda Glatter, MD 12/05/21 9:12 AM

## 2021-12-05 NOTE — Transfer of Care (Signed)
Anesthesia Transfer of Care Note    Patient: Tiffany Suarez    Procedures performed: Procedure(s):  RIGHT CARPAL TUNNEL RELEASE    Anesthesia type: General TIVA    Patient location:Phase I PACU    Last vitals:   Vitals:    12/05/21 1620   BP: 130/64   Pulse: 85   Resp: (!) 10   Temp: 36.2 C (97.2 F)   SpO2: 100%       Post pain: Patient not complaining of pain, continue current therapy      Mental Status:awake    Respiratory Function: tolerating face mask    Cardiovascular: stable    Nausea/Vomiting: patient not complaining of nausea or vomiting    Hydration Status: adequate    Post assessment: no apparent anesthetic complications and no reportable events    Signed by: Margot Ables, CRNA  12/05/21 4:21 PM

## 2021-12-05 NOTE — Anesthesia Postprocedure Evaluation (Signed)
Anesthesia Post Evaluation    Patient: Tiffany Suarez    Procedure(s):  RIGHT CARPAL TUNNEL RELEASE    Anesthesia type: general    Last Vitals:   Vitals Value Taken Time   BP 144/73 12/05/21 1700   Temp 36.4 C (97.5 F) 12/05/21 1700   Pulse 89 12/05/21 1700   Resp 15 12/05/21 1700   SpO2 96 % 12/05/21 1700                 Anesthesia Post Evaluation:     Patient Evaluated: PACU  Patient Participation: complete - patient participated  Level of Consciousness: awake and alert  Pain Score: 1  Pain Management: adequate    Airway Patency: patent        Anesthetic complications: No      PONV Status: none    Cardiovascular status: acceptable  Respiratory status: acceptable  Hydration status: acceptable          Signed by: Hinda Glatter, MD, 12/05/2021 5:56 PM

## 2021-12-05 NOTE — Progress Notes (Signed)
Discharge criteria met. Patient is awake and alert. Pain level is controlled to tolerable level. Respirations easy and non labored. All vital signs remain stable. Surgical dressing clean dry and intact. Discharge instructions printed and discussed with patient and family, both acknowledged understanding of instructions.Copy of instructions provided to patient. Peripheral IV removed. Patient escorted to main lobby by RN via wheelchair, family member to drive patient home.

## 2021-12-08 ENCOUNTER — Encounter: Payer: Self-pay | Admitting: Orthopaedic Surgery

## 2021-12-29 ENCOUNTER — Other Ambulatory Visit: Payer: Self-pay | Admitting: Neurology

## 2022-01-01 IMAGING — CT CT HEART MORP W/ CTA COR W/ SCORE W/ CA W/CM &/OR W/O CM
4 of 7 series · 8 of 20 positions shown, 9 images · IV contrast (APPLIED)
Comparison: None.
COMPARISON: None.

Addendum:
EXAM:
OVER-READ INTERPRETATION  CT CHEST

The following report is an over-read performed by radiologist Dr.
Benne Langrand [REDACTED] on 03/16/2019. This
over-read does not include interpretation of cardiac or coronary
anatomy or pathology. The coronary CTA interpretation by the
cardiologist is attached.
HISTORY: Chest pain, cardiac cause suspected (Ped 0-18y) CHEST PAIN
Cardiac/Coronary CT
TECHNIQUE: The patient was scanned on a Siemens Force scanner.
PROTOCOL: A 120 kV prospective scan was triggered in the descending thoracic
aorta at 111 HU's. Axial non-contrast 3 mm slices were carried out
through the heart. The data set was analyzed on a dedicated work
station and scored using the Agatson method. Gantry rotation speed
was 250 msecs and collimation was 0.6 mm. Beta blockade and 0.8 mg
of sl NTG was given. The 3D data set was reconstructed in 5%
intervals of 35-75% of the R-R cycle. Diastolic phases were analyzed
on a dedicated work station using MPR, MIP and VRT modes. The
patient received 100mL OMNIPAQUE IOHEXOL 350 MG/ML SOLN of contrast.

[Series 6: best diast 74 % · axial · 0.39mm/px · z∈[-52,-4]mm · 2 of 361 slices shown, 3 images]
[im 121/361  vessel]
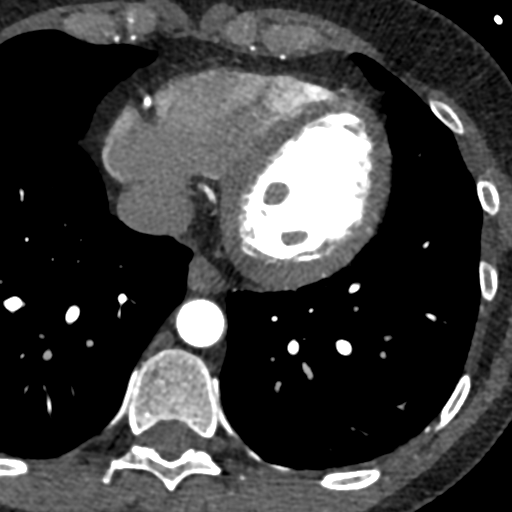
[im 121/361  lung]
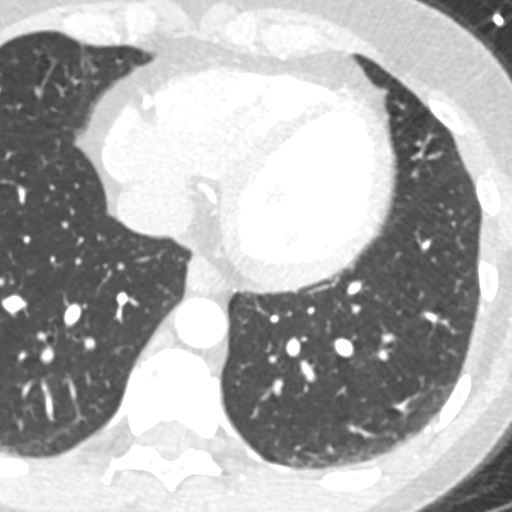
[im 241/361  vessel]
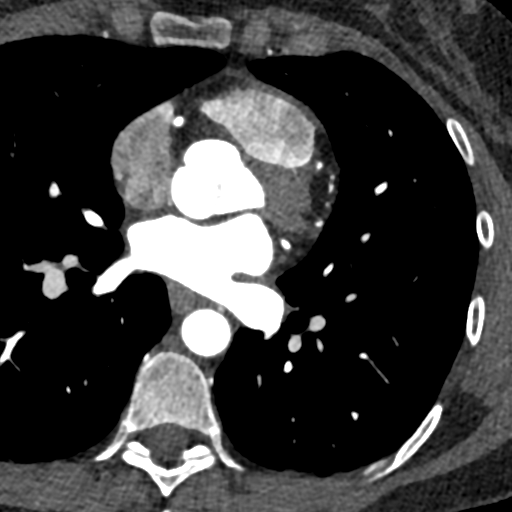

[Series 7: best syst 33 % · axial · 0.39mm/px · z∈[-52,-4]mm · 2 of 361 slices shown]
[im 121/361  vessel]
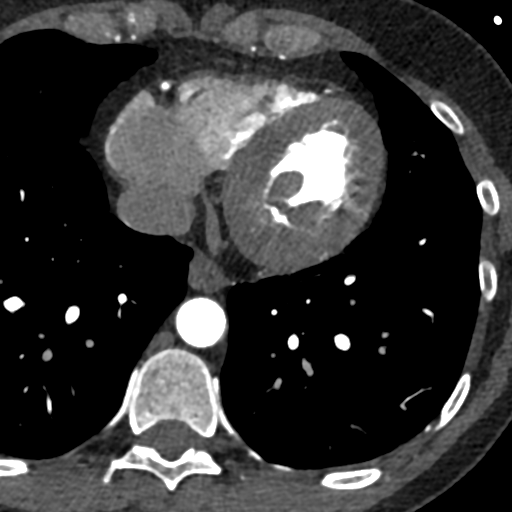
[im 241/361  vessel]
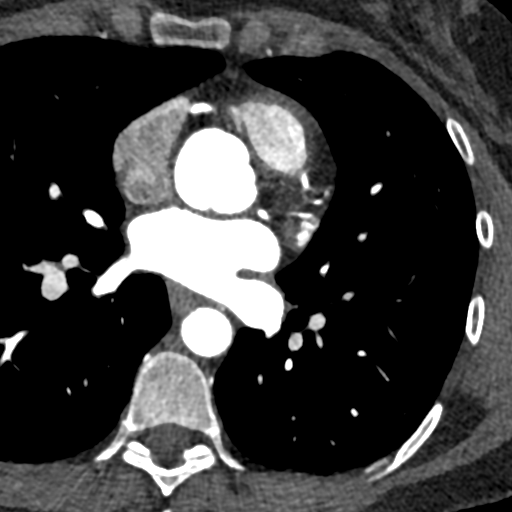

[Series 8: ts diast sharp 33 % · axial · 0.39mm/px · z∈[-52,-4]mm · 2 of 361 slices shown]
[im 121/361  lung]
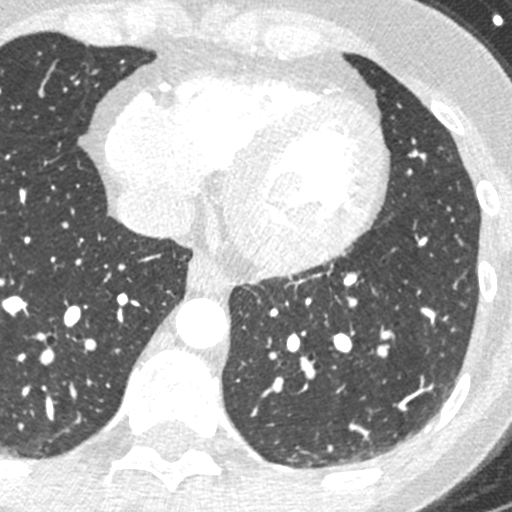
[im 241/361  lung]
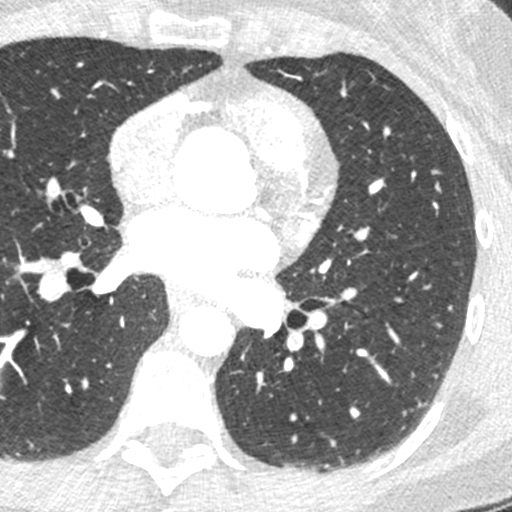

[Series 9: ts syst sharp 33 % · axial · 0.39mm/px · z∈[-52,-4]mm · 2 of 361 slices shown]
[im 121/361  lung]
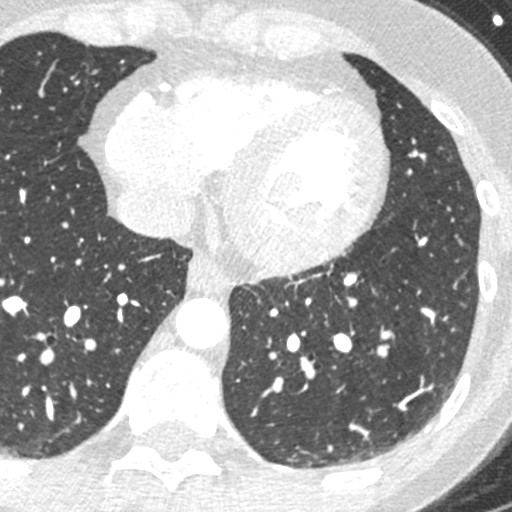
[im 241/361  lung]
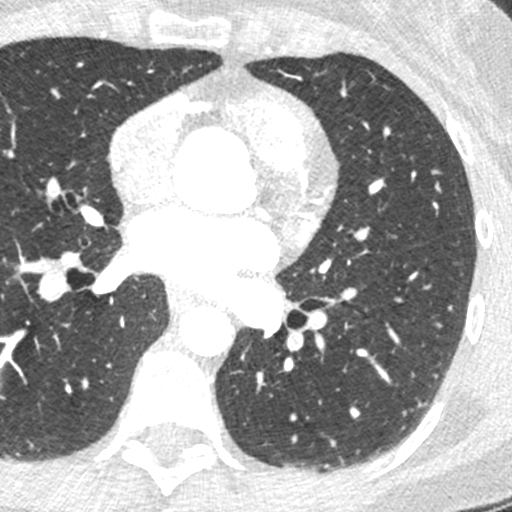

[8 of 20 positions shown; findings below may reference images not displayed]

FINDINGS: Limited view of the lung parenchyma demonstrates 7 mm nodule in the
RIGHT lower lobe (image 3/series 12). Nodule along the anterior
pleural surface of the LEFT lower lobe measures 10 mm (image
47/2.airways are normal.

Limited view of the mediastinum demonstrates no adenopathy.
Esophagus normal.

Limited view of the upper abdomen unremarkable.

Limited view of the skeleton and chest wall is unremarkable.
IMPRESSION: Incidental finding of lower lobe pulmonary nodules. Non-contrast
chest CT at 6-12 months is recommended. If the nodule is stable at
time of repeat CT, then future CT at 18-24 months (from today's
scan) is considered optional for low-risk patients, but is
recommended for high-risk patients. This recommendation follows the
consensus statement: Guidelines for Management of Incidental
Pulmonary Nodules Detected on CT Images: From the [HOSPITAL]

These results will be called to the ordering clinician or
representative by the Radiologist Assistant, and communication
documented in the PACS or zVision Dashboard.
FINDINGS: Coronary calcium score: The patient's coronary artery calcium score
is 0, which places the patient in the 0 percentile.

Coronary arteries: Normal coronary origins.  Right dominance.

Right Coronary Artery: Dominant, large caliber vessel, gives rise to
PDA. No significant plaque or stenosis.

Left Main Coronary Artery: Normal caliber vessel. No significant
plaque or stenosis.

Ramus intermedius: There is a medium caliber ramus without
significant plaque or stenosis.

Left Anterior Descending Coronary Artery: Normal caliber vessel. No
significant plaque or stenosis. Gives rise to 1 large and 1 small
diagonal branches. Distal LAD wraps apex.

Left Circumflex Artery: Normal caliber vessel. No significant plaque
or stenosis. Gives rise to 3 OM branches.

Aorta: Normal size, 30 mm at the mid ascending aorta (level of the
PA bifurcation) measured double oblique. No calcifications. No
dissection.

Aortic Valve: No calcifications. Trileaflet.

Other findings:

Normal pulmonary vein drainage into the left atrium.

Normal left atrial appendage without a thrombus.

Normal size of the pulmonary artery.
IMPRESSION: 1. No evidence of CAD, CADRADS = 0.

2. Coronary calcium score of 0. This was 0 percentile for age and
sex matched control.

3. Normal coronary origin with right dominance.

*** End of Addendum ***
EXAM:
OVER-READ INTERPRETATION  CT CHEST

The following report is an over-read performed by radiologist Dr.
Benne Langrand [REDACTED] on 03/16/2019. This
over-read does not include interpretation of cardiac or coronary
anatomy or pathology. The coronary CTA interpretation by the
cardiologist is attached.
FINDINGS: Limited view of the lung parenchyma demonstrates 7 mm nodule in the
RIGHT lower lobe (image 3/series 12). Nodule along the anterior
pleural surface of the LEFT lower lobe measures 10 mm (image
47/2.airways are normal.

Limited view of the mediastinum demonstrates no adenopathy.
Esophagus normal.

Limited view of the upper abdomen unremarkable.

Limited view of the skeleton and chest wall is unremarkable.
IMPRESSION: Incidental finding of lower lobe pulmonary nodules. Non-contrast
chest CT at 6-12 months is recommended. If the nodule is stable at
time of repeat CT, then future CT at 18-24 months (from today's
scan) is considered optional for low-risk patients, but is
recommended for high-risk patients. This recommendation follows the
consensus statement: Guidelines for Management of Incidental
Pulmonary Nodules Detected on CT Images: From the [HOSPITAL]

These results will be called to the ordering clinician or
representative by the Radiologist Assistant, and communication
documented in the PACS or zVision Dashboard.

## 2022-01-20 ENCOUNTER — Other Ambulatory Visit: Payer: Self-pay | Admitting: Ophthalmology

## 2022-01-20 DIAGNOSIS — H4710 Unspecified papilledema: Secondary | ICD-10-CM

## 2022-01-23 ENCOUNTER — Other Ambulatory Visit: Payer: Self-pay | Admitting: Neurology

## 2022-02-07 ENCOUNTER — Other Ambulatory Visit: Payer: Self-pay | Admitting: Ophthalmology

## 2022-02-07 ENCOUNTER — Ambulatory Visit: Payer: BLUE CROSS/BLUE SHIELD

## 2022-02-07 ENCOUNTER — Ambulatory Visit: Payer: BLUE CROSS/BLUE SHIELD | Attending: Ophthalmology

## 2022-02-07 DIAGNOSIS — H4710 Unspecified papilledema: Secondary | ICD-10-CM

## 2022-02-07 MED ORDER — GADOTERATE MEGLUMINE 7.5 MMOL/15ML IV SOLN (CLARISCAN)
15.0000 mL | Freq: Once | INTRAVENOUS | Status: AC | PRN
Start: 2022-02-07 — End: 2022-02-07
  Administered 2022-02-07: 15 mL via INTRAVENOUS

## 2022-03-11 IMAGING — MG MM BREAST BX W/ LOC DEV 1ST LESION IMAGE BX SPEC STEREO GUIDE*R*
6 series · 6 of 18 positions shown · non-contrast
Comparison: Previous exams.
COMPARISON: Previous exams.

Addendum:
CLINICAL DATA: 41-year-old female with indeterminate calcifications
in the lower outer right breast.

EXAM:
RIGHT BREAST STEREOTACTIC CORE NEEDLE BIOPSY

[R (1 of 3)]
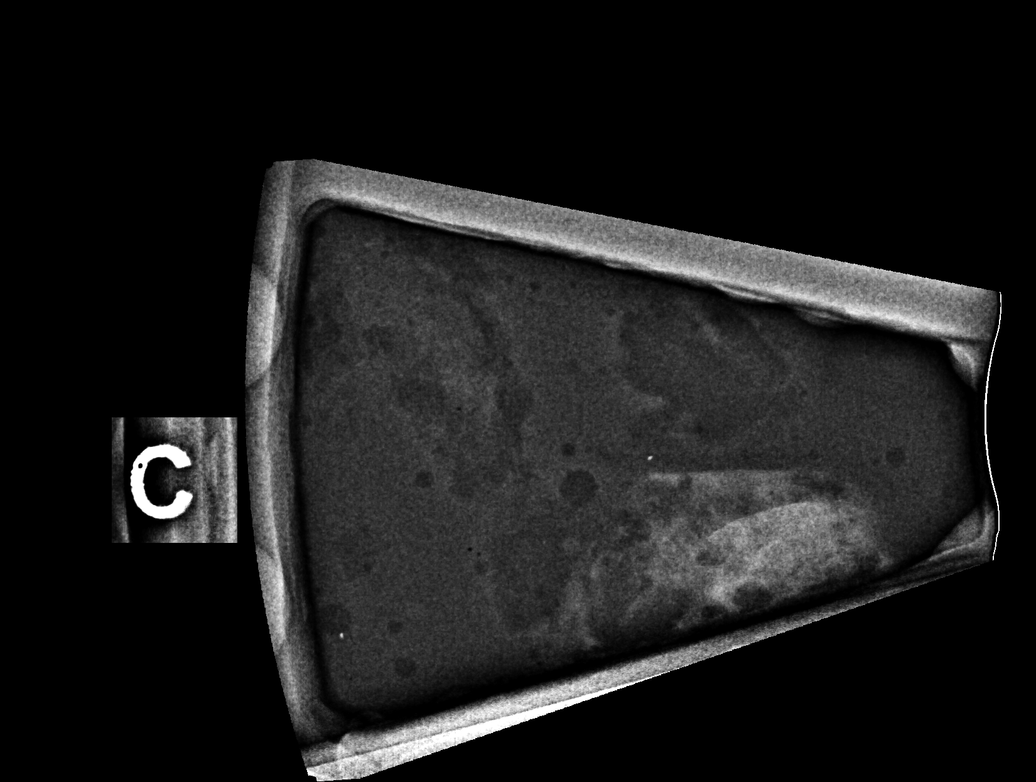

[R (2 of 3)]
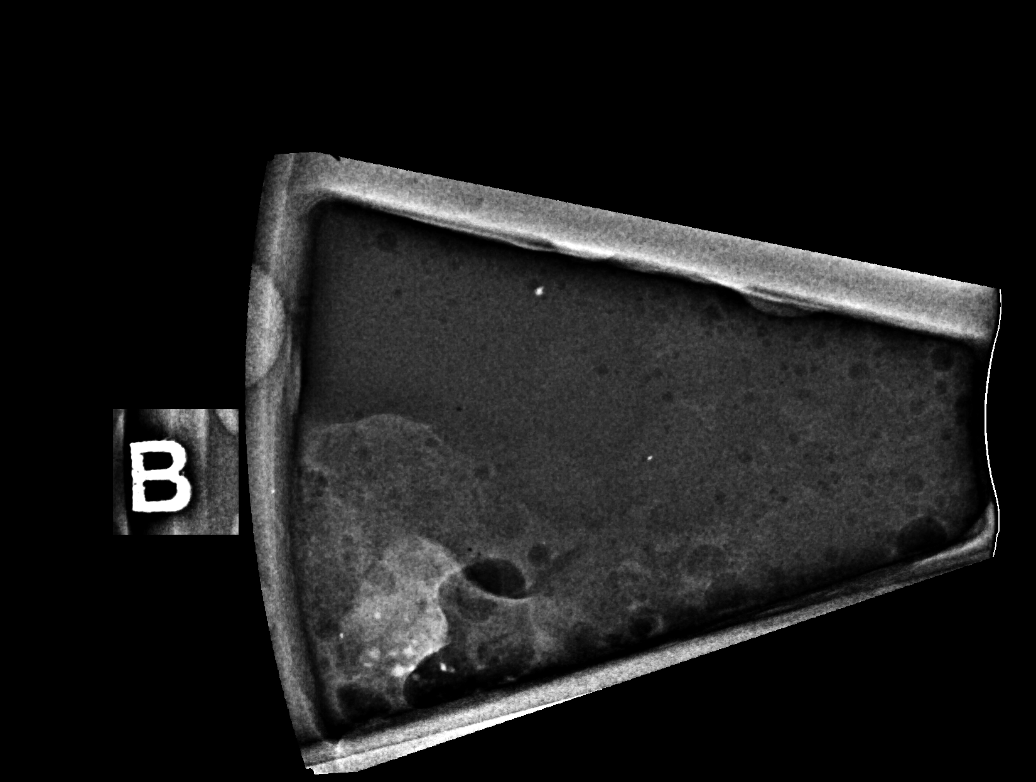

[R (3 of 3)]
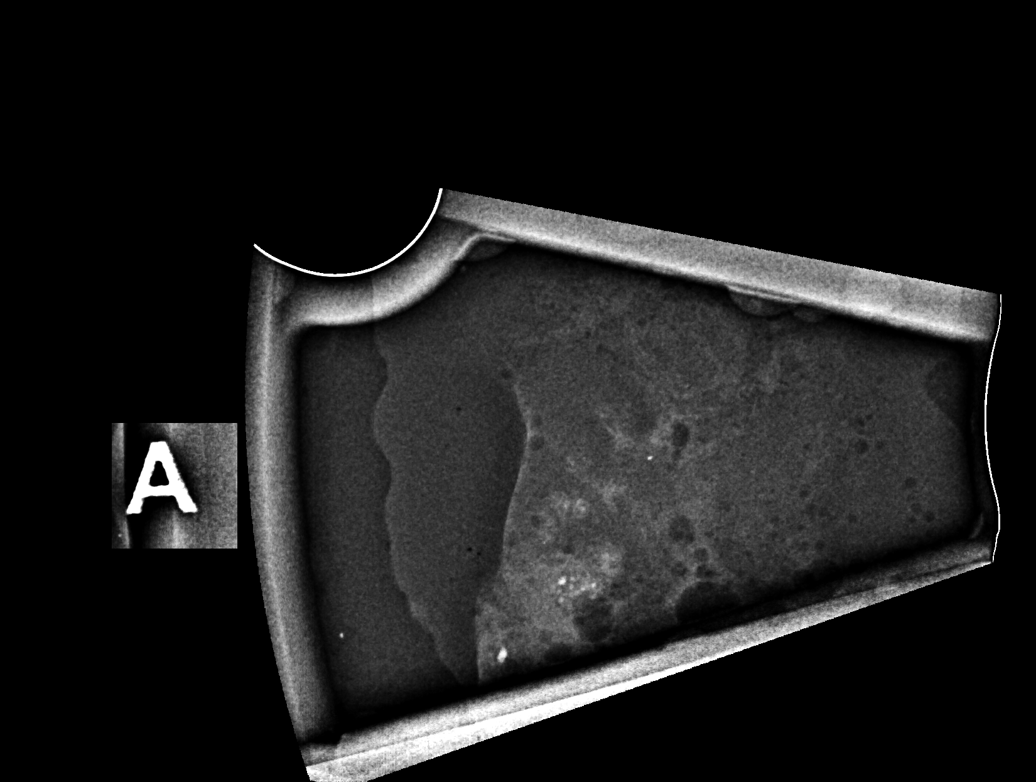

[R LM tomo (1 of 3) · tomo slice 31/61.0]
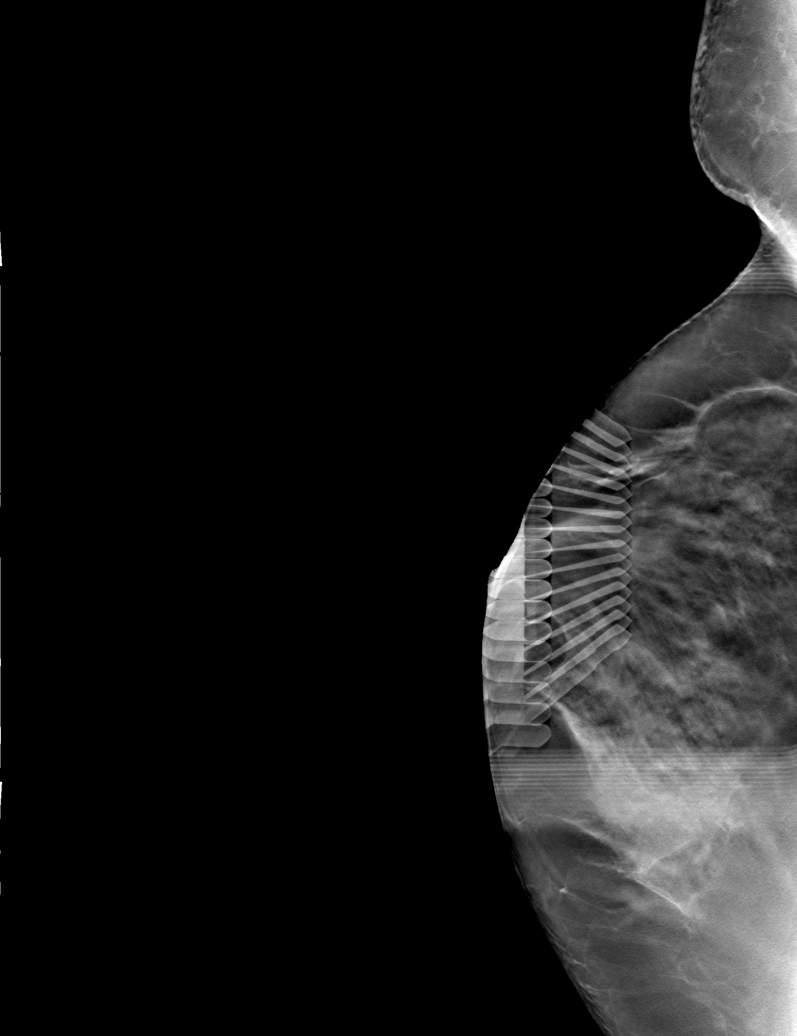

[R LM tomo (2 of 3) · tomo slice 31/61.0]
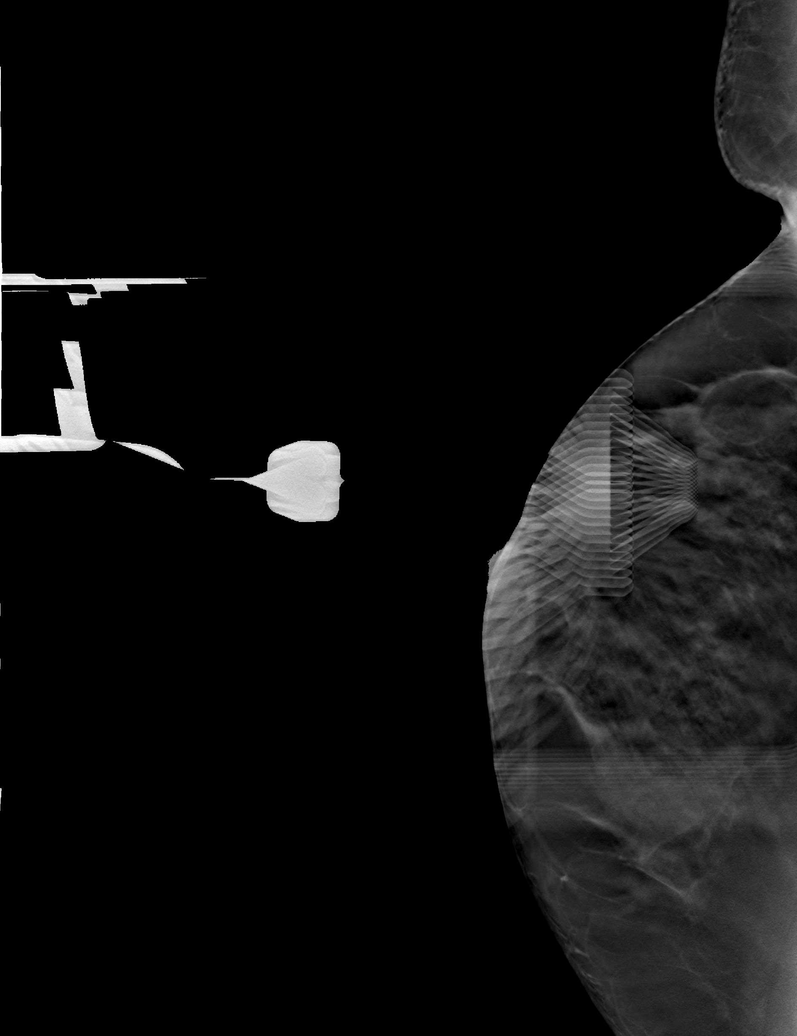

[R LM tomo (3 of 3) · tomo slice 27/54.0]
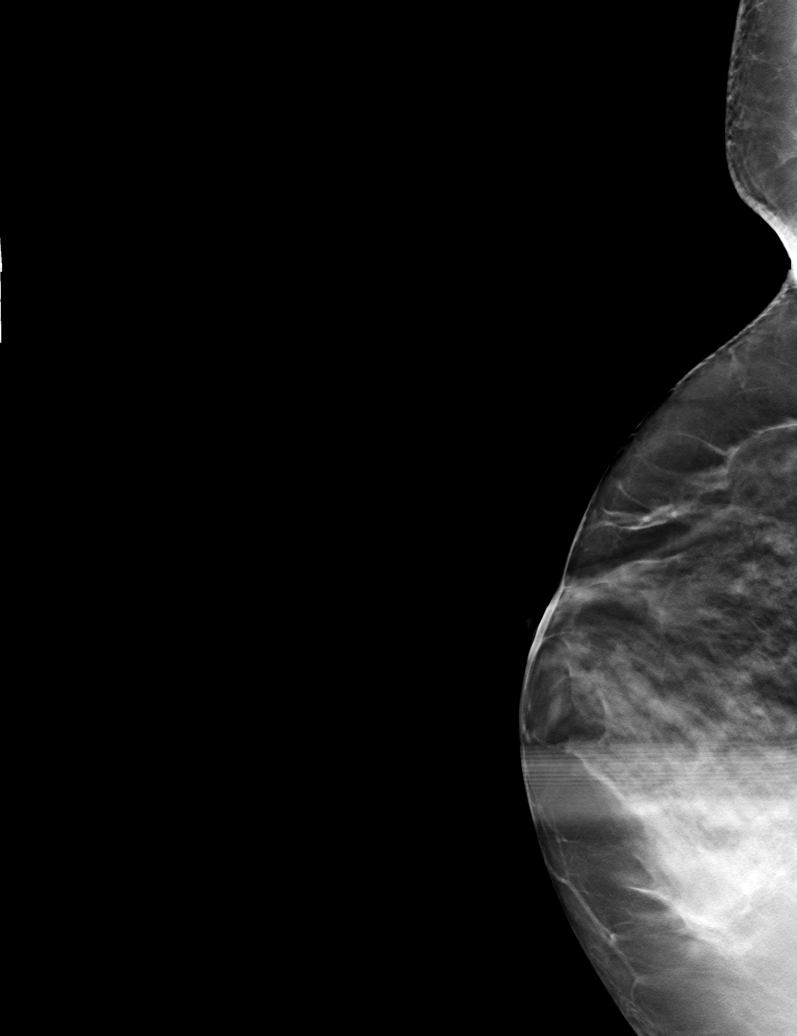

[6 of 18 positions shown; findings below may reference images not displayed]



Using sterile technique and 1% Lidocaine as local anesthetic, under
stereotactic guidance, a 9 gauge vacuum assisted device was used to
perform core needle biopsy of the calcifications in the lower outer
right breast using a lateral to medial approach. Specimen radiograph
was performed showing the presence of calcifications. Specimens with
calcifications are identified for pathology.

Lesion quadrant: Lower outer

At the conclusion of the procedure, a coil shaped tissue marker clip
was deployed into the biopsy cavity. Follow-up 2-view mammogram was
performed and dictated separately.
IMPRESSION: Stereotactic-guided biopsy of the calcifications in the lower outer
right breast. No apparent complications.

ADDENDUM:
Pathology revealed FIBROCYSTIC CHANGE AND USUAL DUCTAL HYPERPLASIA
WITH CALCIFICATIONS of the RIGHT breast, lower outer. This was found
to be concordant by Dr. Joseantonio Coffee.

Pathology results were discussed with the patient by telephone. The
patient reported doing well after the biopsy with tenderness at the
site. Post biopsy instructions and care were reviewed and questions
were answered. The patient was encouraged to call The [REDACTED]

The patient was instructed to return for annual screening
mammography and informed a reminder notice would be sent regarding
this appointment.

Pathology results reported by Shahida Kattan RN on 05/25/2019.



Using sterile technique and 1% Lidocaine as local anesthetic, under
stereotactic guidance, a 9 gauge vacuum assisted device was used to
perform core needle biopsy of the calcifications in the lower outer
right breast using a lateral to medial approach. Specimen radiograph
was performed showing the presence of calcifications. Specimens with
calcifications are identified for pathology.

Lesion quadrant: Lower outer

At the conclusion of the procedure, a coil shaped tissue marker clip
was deployed into the biopsy cavity. Follow-up 2-view mammogram was
performed and dictated separately.
IMPRESSION: Stereotactic-guided biopsy of the calcifications in the lower outer
right breast. No apparent complications.

## 2022-07-14 ENCOUNTER — Other Ambulatory Visit: Payer: Self-pay | Admitting: Neurological Surgery

## 2022-07-14 DIAGNOSIS — D434 Neoplasm of uncertain behavior of spinal cord: Secondary | ICD-10-CM

## 2022-07-28 ENCOUNTER — Other Ambulatory Visit: Payer: Self-pay | Admitting: Neurology

## 2024-03-16 ENCOUNTER — Other Ambulatory Visit: Payer: Self-pay | Admitting: Foot and Ankle Surgery

## 2024-03-16 DIAGNOSIS — M7672 Peroneal tendinitis, left leg: Secondary | ICD-10-CM

## 2024-03-18 ENCOUNTER — Ambulatory Visit
Admission: RE | Admit: 2024-03-18 | Discharge: 2024-03-18 | Payer: BLUE CROSS/BLUE SHIELD | Attending: Foot and Ankle Surgery

## 2024-03-18 DIAGNOSIS — M7672 Peroneal tendinitis, left leg: Secondary | ICD-10-CM | POA: Insufficient documentation
